# Patient Record
Sex: Female | Born: 1935 | Race: White | Hispanic: No | Marital: Married | Smoking: Never smoker
Health system: Southern US, Community
[De-identification: ages and names within clinical notes are randomized; demographics above are authoritative.]

## PROBLEM LIST (undated history)

## (undated) DIAGNOSIS — E669 Obesity, unspecified: Secondary | ICD-10-CM

## (undated) DIAGNOSIS — M1990 Unspecified osteoarthritis, unspecified site: Secondary | ICD-10-CM

## (undated) DIAGNOSIS — M1612 Unilateral primary osteoarthritis, left hip: Secondary | ICD-10-CM

## (undated) DIAGNOSIS — I10 Essential (primary) hypertension: Secondary | ICD-10-CM

## (undated) HISTORY — DX: Unilateral primary osteoarthritis, left hip: M16.12

## (undated) HISTORY — DX: Essential (primary) hypertension: I10

## (undated) HISTORY — DX: Unspecified osteoarthritis, unspecified site: M19.90

## (undated) HISTORY — DX: Obesity, unspecified: E66.9

---
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
## 1975-07-26 HISTORY — PX: ABDOMINAL HYSTERECTOMY: SHX81

## 2008-06-01 ENCOUNTER — Encounter: Payer: Self-pay | Admitting: Internal Medicine

## 2008-06-01 ENCOUNTER — Encounter: Admission: RE | Admit: 2008-06-01 | Discharge: 2008-06-01 | Payer: Self-pay | Admitting: Emergency Medicine

## 2008-06-04 ENCOUNTER — Ambulatory Visit: Payer: Self-pay | Admitting: Internal Medicine

## 2008-06-04 DIAGNOSIS — M545 Low back pain, unspecified: Secondary | ICD-10-CM | POA: Insufficient documentation

## 2008-06-04 DIAGNOSIS — M549 Dorsalgia, unspecified: Secondary | ICD-10-CM

## 2008-06-10 ENCOUNTER — Encounter: Payer: Self-pay | Admitting: Internal Medicine

## 2012-10-15 ENCOUNTER — Ambulatory Visit: Payer: Medicare PPO | Attending: Specialist

## 2012-10-15 DIAGNOSIS — M25659 Stiffness of unspecified hip, not elsewhere classified: Secondary | ICD-10-CM | POA: Insufficient documentation

## 2012-10-15 DIAGNOSIS — IMO0001 Reserved for inherently not codable concepts without codable children: Secondary | ICD-10-CM | POA: Insufficient documentation

## 2012-10-15 DIAGNOSIS — R262 Difficulty in walking, not elsewhere classified: Secondary | ICD-10-CM | POA: Insufficient documentation

## 2012-10-15 DIAGNOSIS — M25559 Pain in unspecified hip: Secondary | ICD-10-CM | POA: Insufficient documentation

## 2012-10-18 ENCOUNTER — Ambulatory Visit: Payer: Medicare PPO

## 2012-10-23 ENCOUNTER — Ambulatory Visit: Payer: Medicare PPO | Attending: Specialist | Admitting: Physical Therapy

## 2012-10-23 DIAGNOSIS — M25559 Pain in unspecified hip: Secondary | ICD-10-CM | POA: Insufficient documentation

## 2012-10-23 DIAGNOSIS — R262 Difficulty in walking, not elsewhere classified: Secondary | ICD-10-CM | POA: Insufficient documentation

## 2012-10-23 DIAGNOSIS — IMO0001 Reserved for inherently not codable concepts without codable children: Secondary | ICD-10-CM | POA: Insufficient documentation

## 2012-10-23 DIAGNOSIS — M25659 Stiffness of unspecified hip, not elsewhere classified: Secondary | ICD-10-CM | POA: Insufficient documentation

## 2012-10-25 ENCOUNTER — Ambulatory Visit: Payer: Medicare PPO | Admitting: Physical Therapy

## 2012-10-30 ENCOUNTER — Ambulatory Visit: Payer: Medicare PPO

## 2012-11-01 ENCOUNTER — Ambulatory Visit: Payer: Medicare PPO | Admitting: Physical Therapy

## 2012-11-06 ENCOUNTER — Ambulatory Visit: Payer: Medicare PPO | Admitting: Physical Therapy

## 2012-11-08 ENCOUNTER — Ambulatory Visit: Payer: Medicare PPO

## 2012-11-13 ENCOUNTER — Ambulatory Visit: Payer: Medicare PPO

## 2012-11-15 ENCOUNTER — Ambulatory Visit: Payer: Medicare PPO

## 2012-11-20 ENCOUNTER — Ambulatory Visit: Payer: 59 | Admitting: Family Medicine

## 2012-11-20 ENCOUNTER — Ambulatory Visit: Payer: Medicare PPO

## 2012-11-22 ENCOUNTER — Ambulatory Visit: Payer: Medicare PPO | Attending: Specialist

## 2012-11-22 DIAGNOSIS — M25659 Stiffness of unspecified hip, not elsewhere classified: Secondary | ICD-10-CM | POA: Insufficient documentation

## 2012-11-22 DIAGNOSIS — M25559 Pain in unspecified hip: Secondary | ICD-10-CM | POA: Insufficient documentation

## 2012-11-22 DIAGNOSIS — R262 Difficulty in walking, not elsewhere classified: Secondary | ICD-10-CM | POA: Insufficient documentation

## 2012-11-22 DIAGNOSIS — IMO0001 Reserved for inherently not codable concepts without codable children: Secondary | ICD-10-CM | POA: Insufficient documentation

## 2012-11-27 ENCOUNTER — Ambulatory Visit: Payer: Medicare PPO | Admitting: Physical Therapy

## 2012-11-29 ENCOUNTER — Ambulatory Visit: Payer: Medicare PPO

## 2012-12-24 ENCOUNTER — Encounter: Payer: Self-pay | Admitting: Family Medicine

## 2012-12-24 ENCOUNTER — Ambulatory Visit (INDEPENDENT_AMBULATORY_CARE_PROVIDER_SITE_OTHER): Payer: 59 | Admitting: Family Medicine

## 2012-12-24 VITALS — BP 146/90 | Temp 98.2°F | Ht 62.5 in | Wt 164.0 lb

## 2012-12-24 DIAGNOSIS — E785 Hyperlipidemia, unspecified: Secondary | ICD-10-CM

## 2012-12-24 DIAGNOSIS — R739 Hyperglycemia, unspecified: Secondary | ICD-10-CM

## 2012-12-24 DIAGNOSIS — R7309 Other abnormal glucose: Secondary | ICD-10-CM

## 2012-12-24 DIAGNOSIS — M1612 Unilateral primary osteoarthritis, left hip: Secondary | ICD-10-CM

## 2012-12-24 DIAGNOSIS — M48061 Spinal stenosis, lumbar region without neurogenic claudication: Secondary | ICD-10-CM

## 2012-12-24 DIAGNOSIS — Z7189 Other specified counseling: Secondary | ICD-10-CM

## 2012-12-24 DIAGNOSIS — E559 Vitamin D deficiency, unspecified: Secondary | ICD-10-CM

## 2012-12-24 DIAGNOSIS — Z7689 Persons encountering health services in other specified circumstances: Secondary | ICD-10-CM

## 2012-12-24 DIAGNOSIS — M169 Osteoarthritis of hip, unspecified: Secondary | ICD-10-CM

## 2012-12-24 LAB — LIPID PANEL
HDL: 120.3 mg/dL (ref >39.00)
Triglycerides: 51 mg/dL (ref 0.0–149.0)
VLDL: 10.2 mg/dL (ref 0.0–40.0)

## 2012-12-24 LAB — BASIC METABOLIC PANEL
CO2: 28 mEq/L (ref 19–32)
Calcium: 10.7 mg/dL — ABNORMAL HIGH (ref 8.4–10.5)
Creatinine, Ser: 0.7 mg/dL (ref 0.4–1.2)
GFR: 80.36 mL/min (ref >60.00)

## 2012-12-24 LAB — HEMOGLOBIN A1C: Hgb A1c MFr Bld: 5.5 % (ref 4.6–6.5)

## 2012-12-24 NOTE — Patient Instructions (Signed)
-  We have ordered labs or studies at this visit. It can take up to 1-2 weeks for results and processing. We will contact you with instructions IF your results are abnormal. Normal results will be released to your [facility]. If you have not heard from [place] or can not find your results in [facility] in 2 weeks please contact our office.  -PLEASE SIGN UP FOR MYCHART TODAY   We recommend the following healthy lifestyle measures: - eat a healthy diet consisting of lots of vegetables, fruits, beans, nuts, seeds, healthy meats such as white chicken and fish and whole grains.  - avoid fried foods, fast food, processed foods, sodas, red meet and other fattening foods.  - get a least 150 minutes of aerobic exercise per week.   -can use topical sports creams with menthol or capsacin for low back pain and tylenol 500-1000mg  up to 3 times daily on bad days for your arthritis. Please continue physical therapy exercises and regular activity.  -please use cane when walking and caution.   Follow up in: 1 year or sooner if concerns

## 2012-12-24 NOTE — Progress Notes (Signed)
Chief Complaint  Patient presents with  . Establish Care    HPI:  Lori Ramirez is here to establish care. Has not had a primary care doctor in five years. Mainly here to establish care. Has some low back pain for some time. Sees Dr. Isaias Cowman for this in ortho, she has had imaging and told has OA and spinal stenosis. Has done PT for this. She does well for the most part, still has some days where has pain. Has tripped in house a few times and has fallen. No injury. Now using cane.  Last PCP and physical: has not had in five years  Has the following chronic problems and concerns today:  Patient Active Problem List   Diagnosis Date Noted  . BACK PAIN 06/04/2008   Health Maintenance: -refused colon cancer screening -refused breast cancer screening -refused tdap -refused zostavax  ROS: See pertinent positives and negatives per HPI.  Past Medical History  Diagnosis Date  . Arthritis   . Osteoarthritis of left hip     Family History  Problem Relation Age of Onset  . Family history unknown: Yes    History   Social History  . Marital Status: Married    Spouse Name: N/A    Number of Children: N/A  . Years of Education: N/A   Social History Main Topics  . Smoking status: Never Smoker   . Smokeless tobacco: None  . Alcohol Use: Yes     Comment: 1 glass of wine daily   . Drug Use: None  . Sexually Active: None   Other Topics Concern  . 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      Spiritual Beliefs: protestant      Lifestyle: no regular exercise, well balanced diet             Current outpatient prescriptions:calcium gluconate 500 MG tablet, Take 500 mg by mouth daily., Disp: , Rfl: ;  Glucosamine-Chondroit-Vit C-Mn (GLUCOSAMINE 1500 COMPLEX PO), Take by mouth., Disp: , Rfl: ;  ibuprofen (ADVIL,MOTRIN) 200 MG tablet, Take 200 mg by mouth every 6 (six) hours as  needed for pain., Disp: , Rfl: ;  Multiple Vitamins-Minerals (ONE-A-DAY WOMENS 50 PLUS PO), Take by mouth., Disp: , Rfl:   EXAM:  Filed Vitals:   12/24/12 1113  BP: 146/90  Temp: 98.2 F (36.8 C)    Body mass index is 29.5 kg/(m^2).  GENERAL: vitals reviewed and listed above, alert, oriented, appears well hydrated and in no acute distress  HEENT: atraumatic, conjunttiva clear, no obvious abnormalities on inspection of external nose and ears  NECK: no obvious masses on inspection  LUNGS: clear to auscultation bilaterally, no wheezes, rales or rhonchi, good air movement  CV: HRRR, no peripheral edema  MS: moves all extremities without noticeable abnormality, walks with cane Normal Gait Normal inspection of back, no obvious scoliosis or leg length descrepancy No bony TTP Soft tissue TTP at: Lumbar paraspinal muscles -/+ tests: neg trendelenburg,-facet loading, -SLRT, -CLRT, -FABER, -FADIR Normal muscle strength, sensation to light touch and DTRs in LEs bilaterally   PSYCH: pleasant and cooperative, no obvious depression or anxiety  ASSESSMENT AND PLAN:  Discussed the following assessment and plan:  Unspecified vitamin D deficiency - Plan: Vitamin D, 25-hydroxy  Hyperglycemia - Plan: Hemoglobin A1c  Hyperlipemia - Plan: Lipid panel  Encounter to establish care - Plan:  Basic metabolic panel  Spinal stenosis of lumbar region  Osteoarthritis of left hip   -We reviewed the PMH, PSH, FH, SH, Meds and Allergies. -We provided refills for any medications we will prescribe as needed. -We addressed current concerns per orders and patient instructions. -We have asked for records for pertinent exams, studies, vaccines and notes from previous providers. -We have advised patient to follow up per instructions below. -we discussed health maintenance measures for her age - she refused colon and breast cancer screening, she refused all offered vaccines -NON-FASTING labs  today  -Patient advised to return or notify a doctor immediately if symptoms worsen or persist or new concerns arise.  Patient Instructions  -We have ordered labs or studies at this visit. It can take up to 1-2 weeks for results and processing. We will contact you with instructions IF your results are abnormal. Normal results will be released to your [facility]. If you have not heard from [place] or can not find your results in [facility] in 2 weeks please contact our office.  -PLEASE SIGN UP FOR MYCHART TODAY   We recommend the following healthy lifestyle measures: - eat a healthy diet consisting of lots of vegetables, fruits, beans, nuts, seeds, healthy meats such as white chicken and fish and whole grains.  - avoid fried foods, fast food, processed foods, sodas, red meet and other fattening foods.  - get a least 150 minutes of aerobic exercise per week.   -can use topical sports creams with menthol or capsacin for low back pain and tylenol 500-1000mg  up to 3 times daily on bad days for your arthritis. Please continue physical therapy exercises and regular activity.  -please use cane when walking and caution.   Follow up in: 1 year or sooner if concerns      Kriste Basque R.

## 2012-12-25 ENCOUNTER — Telehealth: Payer: Self-pay | Admitting: Family Medicine

## 2012-12-25 LAB — VITAMIN D 25 HYDROXY (VIT D DEFICIENCY, FRACTURES): Vit D, 25-Hydroxy: 55 ng/mL (ref 30–89)

## 2012-12-25 NOTE — Telephone Encounter (Signed)
Called and spoke with pt and pt is aware.  

## 2012-12-25 NOTE — Telephone Encounter (Signed)
Let her know labs look good

## 2013-03-08 ENCOUNTER — Other Ambulatory Visit (HOSPITAL_COMMUNITY): Payer: Self-pay | Admitting: Orthopedic Surgery

## 2013-03-08 ENCOUNTER — Encounter (HOSPITAL_COMMUNITY): Payer: Self-pay | Admitting: Pharmacy Technician

## 2013-03-08 NOTE — Patient Instructions (Addendum)
22 Lori Ramirez  03/08/2013   Your procedure is scheduled on: 03-19-2013  Report to [facility] at 1240 PM  Call this number if you have problems the morning of surgery [phone]   Remember:   Do not eat food :After Midnight.   clear liquids midnight until 940 am day of surgery, then nothing by mouth                                   SEE [facility] PREPARING FOR SURGERY SHEET   Do not wear jewelry, make-up or nail polish.  Do not wear lotions, powders, or perfumes. You may wear deodorant.   Men may shave face and neck.  Do not bring valuables to the hospital. [place] [facility] IS NOT RESPONSIBLE FOR VALUEABLES.  Contacts, dentures or bridgework may not be worn into surgery.  Leave suitcase in the car. After surgery it may be brought to your room.  For patients admitted to the hospital, checkout time is 11:00 AM the day of discharge.   Patients discharged the day of surgery will not be allowed to drive home.  Name and phone number of your driver:  Special Instructions: N/A   Please read over the following fact sheets that you were given: MRSA information, clear liquid sheet, incentive spirometer fact sheet, blood fact              sheet  Call Cain Sieve RN pre op nurse if needed [phone]    FAILURE TO FOLLOW THESE INSTRUCTIONS MAY RESULT IN THE CANCELLATION OF YOUR SURGERY.  PATIENT SIGNATURE___________________________________________  NURSE SIGNATURE_____________________________________________

## 2013-03-12 ENCOUNTER — Encounter (HOSPITAL_COMMUNITY)
Admission: RE | Admit: 2013-03-12 | Discharge: 2013-03-12 | Disposition: A | Discharge status: Home / Self Care | Payer: Medicare PPO | Source: Ambulatory Visit | Attending: Orthopedic Surgery | Admitting: Orthopedic Surgery

## 2013-03-12 ENCOUNTER — Encounter (HOSPITAL_COMMUNITY): Payer: Self-pay

## 2013-03-12 DIAGNOSIS — M1610 Unilateral primary osteoarthritis, unspecified hip: Secondary | ICD-10-CM | POA: Insufficient documentation

## 2013-03-12 DIAGNOSIS — E785 Hyperlipidemia, unspecified: Secondary | ICD-10-CM | POA: Insufficient documentation

## 2013-03-12 DIAGNOSIS — M169 Osteoarthritis of hip, unspecified: Secondary | ICD-10-CM | POA: Insufficient documentation

## 2013-03-12 DIAGNOSIS — Z01812 Encounter for preprocedural laboratory examination: Secondary | ICD-10-CM | POA: Insufficient documentation

## 2013-03-12 DIAGNOSIS — E559 Vitamin D deficiency, unspecified: Secondary | ICD-10-CM | POA: Insufficient documentation

## 2013-03-12 LAB — CBC
HCT: 41.6 % (ref 36.0–46.0)
Hemoglobin: 14.3 g/dL (ref 12.0–15.0)
MCH: 33 pg (ref 26.0–34.0)
MCHC: 34.4 g/dL (ref 30.0–36.0)
MCV: 96.1 fL (ref 78.0–100.0)
WBC: 5 10*3/uL (ref 4.0–10.5)

## 2013-03-12 LAB — PROTIME-INR
INR: 0.98 (ref 0.00–1.49)
Prothrombin Time: 12.8 seconds (ref 11.6–15.2)

## 2013-03-12 LAB — BASIC METABOLIC PANEL
Calcium: 10 mg/dL (ref 8.4–10.5)
Chloride: 102 mEq/L (ref 96–112)
Creatinine, Ser: 0.72 mg/dL (ref 0.50–1.10)
GFR calc Af Amer: >90 mL/min (ref >90)
Sodium: 139 mEq/L (ref 135–145)

## 2013-03-12 LAB — URINE MICROSCOPIC-ADD ON

## 2013-03-12 LAB — ABO/RH: ABO/RH(D): O POS

## 2013-03-12 LAB — APTT: aPTT: 26 seconds (ref 24–37)

## 2013-03-12 LAB — URINALYSIS, ROUTINE W REFLEX MICROSCOPIC
Bilirubin Urine: NEGATIVE
Nitrite: NEGATIVE
Specific Gravity, Urine: 1.029 (ref 1.005–1.030)
Urobilinogen, UA: 0.2 mg/dL (ref 0.0–1.0)

## 2013-03-12 LAB — SURGICAL PCR SCREEN: Staphylococcus aureus: NEGATIVE

## 2013-03-12 NOTE — Progress Notes (Signed)
BMp results faxed to Dr. Charlann Boxer per epic

## 2013-03-13 LAB — URINE CULTURE
Colony Count: NO GROWTH
Culture: NO GROWTH

## 2013-03-13 NOTE — Progress Notes (Signed)
Micro ua results faxed by epic to dr olin 

## 2013-03-13 NOTE — H&P (Signed)
TOTAL HIP ADMISSION H&P  Patient is admitted for left total hip arthroplasty, anterior approach.  Subjective:   Chief Complaint: Left hip OA / pain  HPI: Lori Ramirez, 79 y.o. female, has a history of pain and functional disability in the left hip(s) due to arthritis and patient has failed non-surgical conservative treatments for greater than 12 weeks to include NSAID's and/or analgesics, use of assistive devices and activity modification.  Onset of symptoms was gradual starting  years ago with rapidlly worsening for the last 3 months.The patient noted no past surgery on the left hip(s).  Patient currently rates pain in the left hip at 6 out of 10 with activity. Patient has worsening of pain with activity and weight bearing, trendelenberg gait, pain that interfers with activities of daily living and pain with passive range of motion. Patient has evidence of periarticular osteophytes and joint space narrowing by imaging studies. This condition presents safety issues increasing the risk of falls.  There is no current active infection. Risks, benefits and expectations were discussed with the patient. Patient understand the risks, benefits and expectations and wishes to proceed with surgery.   D/C Plans:      Home with HHPT  Post-op Meds:   Rx given for ASA, Zanaflex, Iron, Colace and MiraLax  Tranexamic Acid:   To be given  Decadron:    Not to be given - stated allergies  FYI:    ASA post-op   Stated that she can't take Codeine and tramadol made her sick, wants to try tylenol for pain.  Patient Active Problem List   Diagnosis Date Noted  . BACK PAIN 06/04/2008   Past Medical History  Diagnosis Date  . Arthritis   . Osteoarthritis of left hip     Past Surgical History  Procedure Laterality Date  . Abdominal hysterectomy  1977  . Tonsillectomy and adenoidectomy  1939    Allergies  Allergen Reactions  . Amoxicillin   . Cortisone Acetate     REACTION: rash  . Iodine     REACTION:  rash  . Prednisone     REACTION: rash  . Triamcinolone Acetonide     REACTION: rash    History  Substance Use Topics  . Smoking status: Never Smoker   . Smokeless tobacco: Never Used  . Alcohol Use: 1.2 oz/week    2 Glasses of wine per week     Comment: 1 glass of wine daily       Review of Systems  Constitutional: Negative.   HENT: Negative.   Eyes: Negative.   Respiratory: Negative.   Cardiovascular: Negative.   Gastrointestinal: Negative.   Genitourinary: Negative.   Musculoskeletal: Positive for back pain and joint pain.  Skin: Negative.   Neurological: Negative.   Endo/Heme/Allergies: Negative.   Psychiatric/Behavioral: Negative.     Objective:  Physical Exam  Constitutional: She is oriented to person, place, and time. She appears well-developed and well-nourished.  HENT:  Head: Normocephalic and atraumatic.  Mouth/Throat: Oropharynx is clear and moist.  Eyes: Pupils are equal, round, and reactive to light.  Neck: Neck supple. No JVD present. No tracheal deviation present. No thyromegaly present.  Cardiovascular: Normal rate, regular rhythm, normal heart sounds and intact distal pulses.   Respiratory: Effort normal and breath sounds normal. No stridor. No respiratory distress. She has no wheezes.  GI: Soft. There is no tenderness. There is no guarding.  Musculoskeletal:       Left hip: She exhibits decreased range of motion,  decreased strength, tenderness, bony tenderness and crepitus. She exhibits no swelling, no deformity and no laceration.  Lymphadenopathy:    She has no cervical adenopathy.  Neurological: She is alert and oriented to person, place, and time.  Skin: Skin is warm and dry.  Psychiatric: She has a normal mood and affect.     Labs:  Estimated body mass index is 29.50 kg/(m^2) as calculated from the following:   Height as of 12/24/12: 5' 2.5" (1.588 m).   Weight as of 12/24/12: 74.39 kg (164 lb).   Imaging Review Plain radiographs  demonstrate severe degenerative joint disease of the left hip(s). The bone quality appears to be good for age and reported activity level.  Assessment/Plan:  End stage arthritis, left hip(s)  The patient history, physical examination, clinical judgement of the provider and imaging studies are consistent with end stage degenerative joint disease of the left hip(s) and total hip arthroplasty is deemed medically necessary. The treatment options including medical management, injection therapy, arthroscopy and arthroplasty were discussed at length. The risks and benefits of total hip arthroplasty were presented and reviewed. The risks due to aseptic loosening, infection, stiffness, dislocation/subluxation,  thromboembolic complications and other imponderables were discussed.  The patient acknowledged the explanation, agreed to proceed with the plan and consent was signed. Patient is being admitted for inpatient treatment for surgery, pain control, PT, OT, prophylactic antibiotics, VTE prophylaxis, progressive ambulation and ADL's and discharge planning.The patient is planning to be discharged home with home health services.    Lori Ramirez   PAC  03/13/2013, 5:48 PM

## 2013-03-14 ENCOUNTER — Encounter: Payer: Self-pay | Admitting: Family Medicine

## 2013-03-14 ENCOUNTER — Ambulatory Visit (INDEPENDENT_AMBULATORY_CARE_PROVIDER_SITE_OTHER): Payer: Medicare PPO | Admitting: Family Medicine

## 2013-03-14 VITALS — BP 128/88 | HR 80 | Temp 98.2°F | Wt 160.0 lb

## 2013-03-14 DIAGNOSIS — M169 Osteoarthritis of hip, unspecified: Secondary | ICD-10-CM

## 2013-03-14 NOTE — Progress Notes (Signed)
Chief Complaint  Patient presents with  . Medical Clearance    HPI:  Patient present for optimization of general medical care prior to hip surgery.  Kidney disease? No Prior surgeries/Issues following anesthesia? Multiple prior surgeries (tonsillectomy, hysterectomy) no issues with anesthesia Hx MI, heart arrythmia, CHF, angina or stroke? none Epilepsy or Seizures? none Arthritis or problems with neck or jaw? none Thyroid disease? none Liver disease? none Asthma, COPD or chronic lung disease? none Diabetes? none  Other: Poor nutrition, Frail or other: no  METS: >4  AHA Risks: NONE  Type of surgery/Risk: orthopedic, intermediate  Medications that need to be addressed prior to surgery: None - pt reports she currently is not taking any medications and stopped asa > 7 days prior to surgery.  ROS: See pertinent positives and negatives per HPI. 11 point ROS negative except where noted.  Past Medical History  Diagnosis Date  . Arthritis   . Osteoarthritis of left hip     No family history on file.  History   Social History  . Marital Status: Married    Spouse Name: N/A    Number of Children: N/A  . Years of Education: N/A   Social History Main Topics  . Smoking status: Never Smoker   . Smokeless tobacco: Never Used  . Alcohol Use: 1.2 oz/week    2 Glasses of wine per week     Comment: 1 glass of wine daily   . Drug Use: No  . Sexual Activity: None   Other Topics Concern  . 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 , Disp: , Rfl:  ibuprofen (ADVIL,MOTRIN) 200 MG tablet, Take 200 mg by mouth every 6 (six) hours as needed for pain., Disp: , Rfl: ;  Multiple Vitamins-Minerals (ONE-A-DAY WOMENS 50 PLUS PO), Take by mouth., Disp: , Rfl: ;  OVER THE COUNTER MEDICATION, Pt takes 1 a day stool softener  Pt does not know name, Disp: , Rfl:   EXAM:  Filed Vitals:   03/14/13 0839  BP: 128/88  Pulse: 80  Temp: 98.2 F (36.8 C)    Body mass index is 27.45 kg/(m^2).  GENERAL: vitals reviewed and listed above, alert, oriented, appears well hydrated and in no acute distress  HEENT: atraumatic, conjunttiva clear, no obvious abnormalities on inspection of external nose and ears  NECK: no obvious masses on inspection, no carotid bruits  LUNGS: clear to auscultation bilaterally, no wheezes, rales or rhonchi, good air movement  CV: HRRR, no peripheral edema, no JVD, BP normal range, normal radial pulses  MS: moves all extremities without noticeable abnormality  PSYCH: pleasant and cooperative, no obvious depression or anxiety  ASSESSMENT AND PLAN:  Discussed the following assessment and plan:  Osteoarthrosis, hip  Assessment: -Risk factors: none -Surgery Risks:intermediate -age, nutritional  status, fraility: -functional capacity: > 4 METs wihtout symptoms -comorbidities: none Patient Specific Risks: patient is low risk for intermediate risks surgery   Recommendations for optimizing general medical care prior to surgery: -discussed CV risks for intermediate surgery with patient -advised patient to discuss surgery specific risks with her with surgeon -advised patient will defer to surgeon for post-op DVT prophylaxis and post op care -advised no specific medical recommendations for this patient at this time and no recommendations to defer surgery or for further CV  testing prior to surgery  > 25 minutes spent face to face in counseling this patient  -Patient advised to return or notify a doctor immediately if symptoms worsen or persist or new concerns arise.  There are no Patient Instructions on file for this visit.   Kriste Basque R.

## 2013-03-19 ENCOUNTER — Inpatient Hospital Stay (HOSPITAL_COMMUNITY): Payer: Medicare PPO

## 2013-03-19 ENCOUNTER — Inpatient Hospital Stay (HOSPITAL_COMMUNITY): Payer: Medicare PPO | Admitting: Anesthesiology

## 2013-03-19 ENCOUNTER — Inpatient Hospital Stay (HOSPITAL_COMMUNITY)
Admission: RE | Admit: 2013-03-19 | Discharge: 2013-03-21 | DRG: 470 | Disposition: A | Discharge status: Home Health | Payer: Medicare PPO | Source: Ambulatory Visit | Attending: Orthopedic Surgery | Admitting: Orthopedic Surgery

## 2013-03-19 ENCOUNTER — Encounter (HOSPITAL_COMMUNITY)
Admission: RE | Disposition: A | Discharge status: Home Health | Payer: Self-pay | Source: Ambulatory Visit | Attending: Orthopedic Surgery

## 2013-03-19 ENCOUNTER — Encounter (HOSPITAL_COMMUNITY): Payer: Self-pay | Admitting: Anesthesiology

## 2013-03-19 ENCOUNTER — Encounter (HOSPITAL_COMMUNITY): Payer: Self-pay | Admitting: *Deleted

## 2013-03-19 DIAGNOSIS — Z01812 Encounter for preprocedural laboratory examination: Secondary | ICD-10-CM

## 2013-03-19 DIAGNOSIS — M549 Dorsalgia, unspecified: Secondary | ICD-10-CM | POA: Diagnosis present

## 2013-03-19 DIAGNOSIS — M1610 Unilateral primary osteoarthritis, unspecified hip: Principal | ICD-10-CM | POA: Diagnosis present

## 2013-03-19 DIAGNOSIS — Z96649 Presence of unspecified artificial hip joint: Secondary | ICD-10-CM

## 2013-03-19 DIAGNOSIS — M169 Osteoarthritis of hip, unspecified: Principal | ICD-10-CM | POA: Diagnosis present

## 2013-03-19 HISTORY — PX: TOTAL HIP ARTHROPLASTY: SHX124

## 2013-03-19 LAB — TYPE AND SCREEN
ABO/RH(D): O POS
Antibody Screen: NEGATIVE

## 2013-03-19 SURGERY — ARTHROPLASTY, HIP, TOTAL, ANTERIOR APPROACH
Anesthesia: Spinal | Site: Hip | Laterality: Left

## 2013-03-19 MED ORDER — CELECOXIB 200 MG PO CAPS
200.0000 mg | ORAL_CAPSULE | Freq: Two times a day (BID) | ORAL | Status: DC
  Administered 2013-03-19 – 2013-03-21 (×4): 200 mg via ORAL
  Filled 2013-03-19 (×5): qty 1

## 2013-03-19 MED ORDER — FENTANYL CITRATE 0.05 MG/ML IJ SOLN
INTRAMUSCULAR | Status: DC | PRN
  Administered 2013-03-19: 100 ug via INTRAVENOUS

## 2013-03-19 MED ORDER — BISACODYL 10 MG RE SUPP: 10.0000 mg | Freq: Every day | RECTAL | Status: DC | PRN

## 2013-03-19 MED ORDER — PROMETHAZINE HCL 25 MG/ML IJ SOLN: 6.2500 mg | INTRAMUSCULAR | Status: DC | PRN

## 2013-03-19 MED ORDER — HYDRALAZINE HCL 20 MG/ML IJ SOLN
INTRAMUSCULAR | Status: AC
  Administered 2013-03-19: 10 mg
  Filled 2013-03-19: qty 1

## 2013-03-19 MED ORDER — METHOCARBAMOL 500 MG PO TABS
500.0000 mg | ORAL_TABLET | Freq: Four times a day (QID) | ORAL | Status: DC | PRN
  Filled 2013-03-19: qty 1

## 2013-03-19 MED ORDER — CLINDAMYCIN PHOSPHATE 600 MG/50ML IV SOLN
600.0000 mg | Freq: Four times a day (QID) | INTRAVENOUS | Status: AC
  Administered 2013-03-19 – 2013-03-20 (×2): 600 mg via INTRAVENOUS
  Filled 2013-03-19 (×2): qty 50

## 2013-03-19 MED ORDER — ZOLPIDEM TARTRATE 5 MG PO TABS: 5.0000 mg | ORAL_TABLET | Freq: Every evening | ORAL | Status: DC | PRN

## 2013-03-19 MED ORDER — MIDAZOLAM HCL 5 MG/5ML IJ SOLN
INTRAMUSCULAR | Status: DC | PRN
  Administered 2013-03-19: 2 mg via INTRAVENOUS

## 2013-03-19 MED ORDER — ALUM & MAG HYDROXIDE-SIMETH 200-200-20 MG/5ML PO SUSP: 30.0000 mL | ORAL | Status: DC | PRN

## 2013-03-19 MED ORDER — HYDROCODONE-ACETAMINOPHEN 7.5-325 MG PO TABS
1.0000 | ORAL_TABLET | ORAL | Status: DC | PRN
  Administered 2013-03-19 – 2013-03-20 (×2): 2 via ORAL
  Filled 2013-03-19 (×2): qty 2

## 2013-03-19 MED ORDER — ASPIRIN EC 325 MG PO TBEC
325.0000 mg | DELAYED_RELEASE_TABLET | Freq: Two times a day (BID) | ORAL | Status: DC
  Administered 2013-03-20 – 2013-03-21 (×3): 325 mg via ORAL
  Filled 2013-03-19 (×5): qty 1

## 2013-03-19 MED ORDER — LACTATED RINGERS IV SOLN
INTRAVENOUS | Status: DC
  Administered 2013-03-19: 17:00:00 via INTRAVENOUS
  Administered 2013-03-19 (×2): 1000 mL via INTRAVENOUS

## 2013-03-19 MED ORDER — HYDRALAZINE HCL 20 MG/ML IJ SOLN
10.0000 mg | Freq: Four times a day (QID) | INTRAMUSCULAR | Status: DC | PRN
  Administered 2013-03-19: 23:00:00 via INTRAVENOUS

## 2013-03-19 MED ORDER — ONDANSETRON HCL 4 MG PO TABS: 4.0000 mg | ORAL_TABLET | Freq: Four times a day (QID) | ORAL | Status: DC | PRN

## 2013-03-19 MED ORDER — POLYETHYLENE GLYCOL 3350 17 G PO PACK
17.0000 g | PACK | Freq: Two times a day (BID) | ORAL | Status: DC
  Administered 2013-03-19 – 2013-03-21 (×4): 17 g via ORAL

## 2013-03-19 MED ORDER — FERROUS SULFATE 325 (65 FE) MG PO TABS
325.0000 mg | ORAL_TABLET | Freq: Three times a day (TID) | ORAL | Status: DC
  Administered 2013-03-20 – 2013-03-21 (×2): 325 mg via ORAL
  Filled 2013-03-19 (×7): qty 1

## 2013-03-19 MED ORDER — EPHEDRINE SULFATE 50 MG/ML IJ SOLN
INTRAMUSCULAR | Status: DC | PRN
  Administered 2013-03-19: 5 mg via INTRAVENOUS
  Administered 2013-03-19: 10 mg via INTRAVENOUS
  Administered 2013-03-19: 5 mg via INTRAVENOUS

## 2013-03-19 MED ORDER — HYDROMORPHONE HCL PF 1 MG/ML IJ SOLN
0.5000 mg | INTRAMUSCULAR | Status: DC | PRN
  Administered 2013-03-19: 1 mg via INTRAVENOUS
  Filled 2013-03-19: qty 1

## 2013-03-19 MED ORDER — METOCLOPRAMIDE HCL 10 MG PO TABS: 5.0000 mg | ORAL_TABLET | Freq: Three times a day (TID) | ORAL | Status: DC | PRN

## 2013-03-19 MED ORDER — PROPOFOL INFUSION 10 MG/ML OPTIME
INTRAVENOUS | Status: DC | PRN
  Administered 2013-03-19: 50 ug/kg/min via INTRAVENOUS

## 2013-03-19 MED ORDER — SODIUM CHLORIDE 0.9 % IV SOLN
10.0000 mg | INTRAVENOUS | Status: DC | PRN
  Administered 2013-03-19: 10 ug/min via INTRAVENOUS

## 2013-03-19 MED ORDER — CLINDAMYCIN PHOSPHATE 900 MG/50ML IV SOLN
900.0000 mg | INTRAVENOUS | Status: AC
  Administered 2013-03-19: 900 mg via INTRAVENOUS
  Filled 2013-03-19: qty 50

## 2013-03-19 MED ORDER — HYDROMORPHONE HCL PF 1 MG/ML IJ SOLN: 0.2500 mg | INTRAMUSCULAR | Status: DC | PRN

## 2013-03-19 MED ORDER — BUPIVACAINE HCL (PF) 0.75 % IJ SOLN
INTRAMUSCULAR | Status: DC | PRN
  Administered 2013-03-19: 2 mL via INTRATHECAL

## 2013-03-19 MED ORDER — KETAMINE HCL 10 MG/ML IJ SOLN
INTRAMUSCULAR | Status: DC | PRN
  Administered 2013-03-19: 20 mg via INTRAVENOUS

## 2013-03-19 MED ORDER — METHOCARBAMOL 100 MG/ML IJ SOLN
500.0000 mg | Freq: Four times a day (QID) | INTRAVENOUS | Status: DC | PRN
  Administered 2013-03-19: 500 mg via INTRAVENOUS
  Filled 2013-03-19 (×2): qty 5

## 2013-03-19 MED ORDER — PHENOL 1.4 % MT LIQD
1.0000 | OROMUCOSAL | Status: DC | PRN
  Filled 2013-03-19: qty 177

## 2013-03-19 MED ORDER — MENTHOL 3 MG MT LOZG
1.0000 | LOZENGE | OROMUCOSAL | Status: DC | PRN
  Filled 2013-03-19: qty 9

## 2013-03-19 MED ORDER — ONDANSETRON HCL 4 MG/2ML IJ SOLN
4.0000 mg | Freq: Four times a day (QID) | INTRAMUSCULAR | Status: DC | PRN
  Administered 2013-03-20: 4 mg via INTRAVENOUS
  Filled 2013-03-19: qty 2

## 2013-03-19 MED ORDER — DIPHENHYDRAMINE HCL 25 MG PO CAPS: 25.0000 mg | ORAL_CAPSULE | Freq: Four times a day (QID) | ORAL | Status: DC | PRN

## 2013-03-19 MED ORDER — DOCUSATE SODIUM 100 MG PO CAPS
100.0000 mg | ORAL_CAPSULE | Freq: Two times a day (BID) | ORAL | Status: DC
  Administered 2013-03-19 – 2013-03-21 (×4): 100 mg via ORAL

## 2013-03-19 MED ORDER — TRANEXAMIC ACID 100 MG/ML IV SOLN
1000.0000 mg | Freq: Once | INTRAVENOUS | Status: AC
  Administered 2013-03-19: 1000 mg via INTRAVENOUS
  Filled 2013-03-19: qty 10

## 2013-03-19 MED ORDER — SODIUM CHLORIDE 0.9 % IV SOLN
100.0000 mL/h | INTRAVENOUS | Status: DC
  Administered 2013-03-20: 100 mL/h via INTRAVENOUS
  Filled 2013-03-19 (×5): qty 1000

## 2013-03-19 MED ORDER — FLEET ENEMA 7-19 GM/118ML RE ENEM: 1.0000 | ENEMA | Freq: Once | RECTAL | Status: AC | PRN

## 2013-03-19 MED ORDER — 0.9 % SODIUM CHLORIDE (POUR BTL) OPTIME
TOPICAL | Status: DC | PRN
  Administered 2013-03-19: 1000 mL

## 2013-03-19 MED ORDER — METOCLOPRAMIDE HCL 5 MG/ML IJ SOLN: 5.0000 mg | Freq: Three times a day (TID) | INTRAMUSCULAR | Status: DC | PRN

## 2013-03-19 SURGICAL SUPPLY — 42 items
ADH SKN CLS APL DERMABOND .7 (GAUZE/BANDAGES/DRESSINGS) ×1
BAG SPEC THK2 15X12 ZIP CLS (MISCELLANEOUS) ×2
BAG ZIPLOCK 12X15 (MISCELLANEOUS) ×4 IMPLANT
BLADE SAW SGTL 18X1.27X75 (BLADE) ×2 IMPLANT
CAPT HIP PF MOP ×1 IMPLANT
CHLORAPREP W/TINT 26ML (MISCELLANEOUS) ×1 IMPLANT
CLOTH BEACON ORANGE TIMEOUT ST (SAFETY) ×2 IMPLANT
DERMABOND ADVANCED (GAUZE/BANDAGES/DRESSINGS) ×1
DERMABOND ADVANCED .7 DNX12 (GAUZE/BANDAGES/DRESSINGS) ×1 IMPLANT
DRAPE C-ARM 42X120 X-RAY (DRAPES) ×2 IMPLANT
DRAPE STERI IOBAN 125X83 (DRAPES) ×1 IMPLANT
DRAPE U-SHAPE 47X51 STRL (DRAPES) ×6 IMPLANT
DRSG AQUACEL AG ADV 3.5X 4 (GAUZE/BANDAGES/DRESSINGS) ×2 IMPLANT
DRSG AQUACEL AG ADV 3.5X10 (GAUZE/BANDAGES/DRESSINGS) ×2 IMPLANT
DRSG TEGADERM 4X4.75 (GAUZE/BANDAGES/DRESSINGS) IMPLANT
DURAPREP 26ML APPLICATOR (WOUND CARE) ×1 IMPLANT
ELECT BLADE TIP CTD 4 INCH (ELECTRODE) ×2 IMPLANT
ELECT REM PT RETURN 9FT ADLT (ELECTROSURGICAL) ×2
ELECTRODE REM PT RTRN 9FT ADLT (ELECTROSURGICAL) ×1 IMPLANT
EVACUATOR 1/8 PVC DRAIN (DRAIN) IMPLANT
FACESHIELD LNG OPTICON STERILE (SAFETY) ×8 IMPLANT
GAUZE SPONGE 2X2 8PLY STRL LF (GAUZE/BANDAGES/DRESSINGS) ×1 IMPLANT
GLOVE BIOGEL PI IND STRL 7.5 (GLOVE) ×1 IMPLANT
GLOVE BIOGEL PI IND STRL 8 (GLOVE) ×1 IMPLANT
GLOVE BIOGEL PI INDICATOR 7.5 (GLOVE) ×1
GLOVE BIOGEL PI INDICATOR 8 (GLOVE) ×1
GLOVE ECLIPSE 8.0 STRL XLNG CF (GLOVE) ×2 IMPLANT
GLOVE ORTHO TXT STRL SZ7.5 (GLOVE) ×4 IMPLANT
GOWN BRE IMP PREV XXLGXLNG (GOWN DISPOSABLE) ×2 IMPLANT
GOWN STRL NON-REIN LRG LVL3 (GOWN DISPOSABLE) ×2 IMPLANT
KIT BASIN OR (CUSTOM PROCEDURE TRAY) ×2 IMPLANT
PACK TOTAL JOINT (CUSTOM PROCEDURE TRAY) ×2 IMPLANT
PADDING CAST COTTON 6X4 STRL (CAST SUPPLIES) ×2 IMPLANT
SPONGE GAUZE 2X2 STER 10/PKG (GAUZE/BANDAGES/DRESSINGS)
SUCTION FRAZIER 12FR DISP (SUCTIONS) ×2 IMPLANT
SUT MNCRL AB 4-0 PS2 18 (SUTURE) ×2 IMPLANT
SUT VIC AB 1 CT1 36 (SUTURE) ×8 IMPLANT
SUT VIC AB 2-0 CT1 27 (SUTURE) ×4
SUT VIC AB 2-0 CT1 TAPERPNT 27 (SUTURE) ×2 IMPLANT
SUT VLOC 180 0 24IN GS25 (SUTURE) ×2 IMPLANT
TOWEL OR 17X26 10 PK STRL BLUE (TOWEL DISPOSABLE) ×4 IMPLANT
TRAY FOLEY CATH 14FRSI W/METER (CATHETERS) ×2 IMPLANT

## 2013-03-19 NOTE — Interval H&P Note (Signed)
History and Physical Interval Note:  03/19/2013 1:26 PM  Lori Ramirez  has presented today for surgery, with the diagnosis of left hip osteoarthritis  The various methods of treatment have been discussed with the patient and family. After consideration of risks, benefits and other options for treatment, the patient has consented to  Procedure(s): LEFT TOTAL HIP ARTHROPLASTY ANTERIOR APPROACH (Left) as a surgical intervention .  The patient's history has been reviewed, patient examined, no change in status, stable for surgery.  I have reviewed the patient's chart and labs.  Questions were answered to the patient's satisfaction.     [name]

## 2013-03-19 NOTE — Transfer of Care (Signed)
Immediate Anesthesia Transfer of Care Note  Patient: Lori Ramirez  Procedure(s) Performed: Procedure(s): LEFT TOTAL HIP ARTHROPLASTY ANTERIOR APPROACH (Left)  Patient Location: PACU  Anesthesia Type:Spinal  Level of Consciousness: sedated  Airway & Oxygen Therapy: Patient Spontanous Breathing and Patient connected to face mask oxygen  Post-op Assessment: Report given to PACU RN and Post -op Vital signs reviewed and stable  Post vital signs: Reviewed and stable  Complications: No apparent anesthesia complications

## 2013-03-19 NOTE — Op Note (Signed)
NAME:  Lori Ramirez                ACCOUNT NO.: [id number]      MEDICAL RECORD NO.: [id number]      FACILITY:  [facility]      PHYSICIAN:  Durene Romans D  DATE OF BIRTH:  18-Dec-1930     DATE OF PROCEDURE:  03/19/2013                                 OPERATIVE REPORT         PREOPERATIVE DIAGNOSIS: Left  hip osteoarthritis.      POSTOPERATIVE DIAGNOSIS:  Left hip severe osteoarthritis.      PROCEDURE:  Left total hip replacement through an anterior approach   utilizing DePuy THR system, component size 50mm pinnacle cup, a size 32+4 neutral   Altrex liner, a size 1 Hi Tri Lock stem with a 32+5 articuleze metal ball      SURGEON:  Madlyn Frankel. Charlann Boxer, M.D.      ASSISTANT:  Lanney Gins, PA-C      ANESTHESIA:  General.      SPECIMENS:  None.      COMPLICATIONS:  None.      BLOOD LOSS:  250 cc     DRAINS:  One Hemovac.      INDICATION OF THE PROCEDURE:  Lori Ramirez is a 79 y.o. female who had   presented to office for evaluation of left hip pain.  Radiographs revealed   severe degenerative changes with bone-on-bone   articulation to the  hip joint including deformity of the femoral head.  The patient had painful limited range of   motion significantly affecting their overall quality of life.  The patient was failing to    respond to conservative measures, and at this point was ready   to proceed with more definitive measures.  The patient has noted progressive   degenerative changes in his hip, progressive problems and dysfunction   with regarding the hip prior to surgery.  Consent was obtained for   benefit of pain relief.  Specific risk of infection, DVT, component   failure, dislocation, need for revision surgery, as well discussion of   the anterior versus posterior approach were reviewed.  Consent was   obtained for benefit of anterior pain relief through an anterior   approach.      PROCEDURE IN DETAIL:  The patient was brought to operative  theater.   Once adequate anesthesia, preoperative antibiotics, 900mg  of Clindamycin administered.   The patient was positioned supine on the OSI Hanna table.  Once adequate   padding of boney process was carried out, we had predraped out the hip, and  used fluoroscopy to confirm orientation of the pelvis and position.      The left hip was then prepped and draped from proximal iliac crest to   mid thigh with shower curtain technique.      Time-out was performed identifying the patient, planned procedure, and   extremity.     An incision was then made 2 cm distal and lateral to the   anterior superior iliac spine extending over the orientation of the   tensor fascia lata muscle and sharp dissection was carried down to the   fascia of the muscle and protractor placed in the soft tissues.      The fascia was then incised.  The muscle belly was identified and swept   laterally and retractor placed along the superior neck.  Following   cauterization of the circumflex vessels and removing some pericapsular   fat, a second cobra retractor was placed on the inferior neck.  A third   retractor was placed on the anterior acetabulum after elevating the   anterior rectus.  A L-capsulotomy was along the line of the   superior neck to the trochanteric fossa, then extended proximally and   distally.  Tag sutures were placed and the retractors were then placed   intracapsular.  We then identified the trochanteric fossa and   orientation of my neck cut, confirmed this radiographically   and then made a neck osteotomy with the femur on traction.  The femoral   head was removed without difficulty or complication.  Traction was let   off and retractors were placed posterior and anterior around the   acetabulum.      The labrum and foveal tissue were debrided.  I began reaming with a 47mm   reamer and reamed up to 49mm reamer with good bony bed preparation and a 50   cup was chosen.  The final 50mm  Pinnacle cup was then impacted under fluoroscopy  to confirm the depth of penetration and orientation with respect to   abduction.  A screw was placed followed by the hole eliminator.  The final   32+4 neutral Altrex liner was impacted with good visualized rim fit.  The cup was positioned anatomically within the acetabular portion of the pelvis.      At this point, the femur was rolled at 80 degrees.  Further capsule was   released off the inferior aspect of the femoral neck.  I then   released the superior capsule proximally.  The hook was placed laterally   along the femur and elevated manually and held in position with the bed   hook.  The leg was then extended and adducted with the leg rolled to 100   degrees of external rotation.  Once the proximal femur was fully   exposed, I used a box osteotome to set orientation.  I then began   broaching with the starting chili pepper broach and passed this by hand and then broached up to 1.  With the 1 broach in place I chose a high offset neck and did a trial reduction.  The offset was appropriate, leg lengths   appeared to be equal, confirmed radiographically.   Given these findings, I went ahead and dislocated the hip, repositioned all   retractors and positioned the right hip in the extended and abducted position.  The final size 1 Tri Lock stem was   chosen and it was impacted down to the level of neck cut.  Based on this   and the trial reduction, a 32 +5 articuleze metal ball was chosen and   impacted onto a clean and dry trunnion, and the hip was reduced.  The   hip had been irrigated throughout the case again at this point.  I did   reapproximate the superior capsular leaflet to the anterior leaflet   using #1 Vicryl, placed a medium Hemovac drain deep.  The fascia of the   tensor fascia lata muscle was then reapproximated using #1 Vicryl.  The   remaining wound was closed with 2-0 Vicryl and running 4-0 Monocryl.   The hip was cleaned,  dried, and dressed sterilely using Dermabond and  Aquacel dressing.  Drain site dressed separately.  She was then brought   to recovery room in stable condition tolerating the procedure well.    Danae Orleans, PA-C was present for the entirety of the case involved from   preoperative positioning, perioperative retractor management, general   facilitation of the case, as well as primary wound closure as assistant.            Pietro Cassis Alvan Dame, M.D.            MDO/MEDQ  D:  05/17/2011  T:  05/17/2011  Job:  [id number]      Electronically Signed by Paralee Cancel M.D. on 05/23/2011 09:15:38 AM

## 2013-03-19 NOTE — Anesthesia Postprocedure Evaluation (Signed)
  Anesthesia Post-op Note  Patient: Lori Ramirez  Procedure(s) Performed: Procedure(s) (LRB): LEFT TOTAL HIP ARTHROPLASTY ANTERIOR APPROACH (Left)  Patient Location: PACU  Anesthesia Type: Spinal  Level of Consciousness: awake and alert   Airway and Oxygen Therapy: Patient Spontanous Breathing  Post-op Pain: mild  Post-op Assessment: Post-op Vital signs reviewed, Patient's Cardiovascular Status Stable, Respiratory Function Stable, Patent Airway and No signs of Nausea or vomiting  Last Vitals:  Filed Vitals:   03/19/13 1949  BP: 176/87  Pulse: 56  Temp: 36.4 C  Resp:     Post-op Vital Signs: stable   Complications: No apparent anesthesia complications. Moving both feet.

## 2013-03-19 NOTE — Anesthesia Preprocedure Evaluation (Signed)
Anesthesia Evaluation  Patient identified by MRN, date of birth, ID band Patient awake    Reviewed: Allergy & Precautions, H&P , NPO status , Patient's Chart, lab work & pertinent test results  Airway Mallampati: II TM Distance: >3 FB Neck ROM: Full    Dental no notable dental hx.    Pulmonary neg pulmonary ROS,  breath sounds clear to auscultation  Pulmonary exam normal       Cardiovascular Exercise Tolerance: Good negative cardio ROS  Rhythm:Regular Rate:Normal     Neuro/Psych Back pain negative neurological ROS  negative psych ROS   GI/Hepatic negative GI ROS, Neg liver ROS,   Endo/Other  negative endocrine ROS  Renal/GU negative Renal ROS  negative genitourinary   Musculoskeletal negative musculoskeletal ROS (+)   Abdominal   Peds negative pediatric ROS (+)  Hematology negative hematology ROS (+)   Anesthesia Other Findings   Reproductive/Obstetrics negative OB ROS                           Anesthesia Physical Anesthesia Plan  ASA: II  Anesthesia Plan: Spinal   Post-op Pain Management:    Induction: Intravenous  Airway Management Planned:   Additional Equipment:   Intra-op Plan:   Post-operative Plan:   Informed Consent: I have reviewed the patients History and Physical, chart, labs and discussed the procedure including the risks, benefits and alternatives for the proposed anesthesia with the patient or authorized representative who has indicated his/her understanding and acceptance.   Dental advisory given  Plan Discussed with: CRNA  Anesthesia Plan Comments: (Discussed risks/benefits of spinal including headache, backache, failure, bleeding, infection, and nerve damage. Patient consents to spinal. Questions answered. Coagulation studies and platelet count acceptable. She does have a h/o back pain and sciatica.)        Anesthesia Quick Evaluation

## 2013-03-19 NOTE — Progress Notes (Signed)
Called MD/PA-C on call as post op VS reveal elevated Systolic BP 176/87 then 164/80 then 189/85. Will make PA-C aware prior to MN.

## 2013-03-19 NOTE — Anesthesia Procedure Notes (Signed)
Spinal  Start time: 03/19/2013 4:47 PM End time: 03/19/2013 4:50 PM Staffing CRNA/Resident: Carmelia Roller R Preanesthetic Checklist Completed: patient identified, site marked, surgical consent, pre-op evaluation, timeout performed, IV checked, risks and benefits discussed and monitors and equipment checked Spinal Block Patient position: sitting Prep: ChloraPrep and site prepped and draped Patient monitoring: heart rate, continuous pulse ox and blood pressure Approach: midline Location: L3-4 Injection technique: single-shot Needle Needle gauge: 22 G Needle length: 9 cm Assessment Sensory level: T4

## 2013-03-20 LAB — CBC
HCT: 35.2 % — ABNORMAL LOW (ref 36.0–46.0)
Hemoglobin: 12.3 g/dL (ref 12.0–15.0)
MCH: 33.1 pg (ref 26.0–34.0)
MCV: 94.6 fL (ref 78.0–100.0)
RBC: 3.72 MIL/uL — ABNORMAL LOW (ref 3.87–5.11)

## 2013-03-20 LAB — BASIC METABOLIC PANEL
CO2: 28 mEq/L (ref 19–32)
Calcium: 9.2 mg/dL (ref 8.4–10.5)
Creatinine, Ser: 0.6 mg/dL (ref 0.50–1.10)
Glucose, Bld: 152 mg/dL — ABNORMAL HIGH (ref 70–99)

## 2013-03-20 MED ORDER — POLYETHYLENE GLYCOL 3350 17 G PO PACK: 17.0000 g | PACK | Freq: Two times a day (BID) | ORAL | Status: DC

## 2013-03-20 MED ORDER — HYDROCODONE-ACETAMINOPHEN 5-325 MG PO TABS: 1.0000 | ORAL_TABLET | ORAL | Status: DC | PRN

## 2013-03-20 MED ORDER — FERROUS SULFATE 325 (65 FE) MG PO TABS: 325.0000 mg | ORAL_TABLET | Freq: Three times a day (TID) | ORAL | Status: DC

## 2013-03-20 MED ORDER — ACETAMINOPHEN 325 MG PO TABS
325.0000 mg | ORAL_TABLET | Freq: Four times a day (QID) | ORAL | Status: DC | PRN
  Administered 2013-03-20 – 2013-03-21 (×2): 650 mg via ORAL
  Filled 2013-03-20 (×2): qty 2

## 2013-03-20 MED ORDER — TIZANIDINE HCL 4 MG PO CAPS: 4.0000 mg | ORAL_CAPSULE | Freq: Three times a day (TID) | ORAL | Status: DC | PRN

## 2013-03-20 MED ORDER — DSS 100 MG PO CAPS: 100.0000 mg | ORAL_CAPSULE | Freq: Two times a day (BID) | ORAL | Status: DC

## 2013-03-20 MED ORDER — ASPIRIN 325 MG PO TBEC: 325.0000 mg | DELAYED_RELEASE_TABLET | Freq: Two times a day (BID) | ORAL | Status: AC

## 2013-03-20 NOTE — Progress Notes (Signed)
Physical Therapy Treatment Patient Details Name: Lori Ramirez MRN: [id number] DOB: 1931-02-25 Today's Date: 03/20/2013 Time: 1138-1207 PT Time Calculation (min): 29 min  PT Assessment / Plan / Recommendation  History of Present Illness s/p L DATHA on 03/19/13   PT Comments   Pt continues to be nauseated. Wants to try a little lunch. Will see again this PM and practice steps  And decide if pt is ready for DC. Pt would benefit from overnight stay due to nausea and has 16 steps to bed/bath. Pt currently declines a hospital bed.  Follow Up Recommendations  Home health PT     Does the patient have the potential to tolerate intense rehabilitation     Barriers to Discharge        Equipment Recommendations  None recommended by PT (discussed need for hospital bed if steps are too difficult. )    Recommendations for Other Services    Frequency 7X/week   Progress towards PT Goals Progress towards PT goals: Progressing toward goals  Plan Current plan remains appropriate    Precautions / Restrictions Precautions Precautions: Fall Restrictions Weight Bearing Restrictions: No   Pertinent Vitals/Pain     Mobility  Bed Mobility Bed Mobility: Supine to Sit;Sitting - Scoot to Edge of Bed Supine to Sit: 4: Min assist Sitting - Scoot to Edge of Bed: 4: Min guard Details for Bed Mobility Assistance: cues for technique. Transfers Transfers: Sit to Stand;Stand to Sit Sit to Stand: From toilet;With upper extremity assist;From chair/3-in-1;4: Min guard Stand to Sit: To chair/3-in-1;To toilet;4: Min guard Details for Transfer Assistance: cues for reaching back to armrests., LLE position. Ambulation/Gait Ambulation/Gait Assistance: 4: Min assist Ambulation Distance (Feet): 150 Feet Assistive device: Rolling walker Ambulation/Gait Assistance Details: pt moving better, cues to stay inside RW Gait Pattern: Step-through pattern;Decreased step length - left;Trunk flexed    Exercises     PT  Diagnosis: Difficulty walking  PT Problem List: Decreased strength;Decreased activity tolerance;Decreased range of motion;Decreased mobility PT Treatment Interventions: DME instruction;Gait training;Stair training;Functional mobility training;Therapeutic activities;Therapeutic exercise;Patient/family education   PT Goals (current goals can now be found in the care plan section) Acute Rehab PT Goals Patient Stated Goal: to go home today. PT Goal Formulation: With patient/family Time For Goal Achievement: 03/27/13 Potential to Achieve Goals: Good  Visit Information  Last PT Received On: 03/20/13 Assistance Needed: +1 History of Present Illness: s/p L DATHA on 03/19/13    Subjective Data  Patient Stated Goal: to go home today.   Cognition  Cognition Arousal/Alertness: Awake/alert Behavior During Therapy: WFL for tasks assessed/performed Overall Cognitive Status: Within Functional Limits for tasks assessed    Balance  Balance Balance Assessed: Yes Dynamic Standing Balance Dynamic Standing - Level of Assistance: 4: Min assist  End of Session PT - End of Session Activity Tolerance: Patient tolerated treatment well Patient left: in chair;with call bell/phone within reach;with family/visitor present Nurse Communication: Mobility status (nausea.)   GP     Rada Hay 03/20/2013, 1:04 PM Blanchard Kelch PT [phone]

## 2013-03-20 NOTE — Progress Notes (Signed)
Physical Therapy Treatment Patient Details Name: Lori Ramirez MRN: [id number] DOB: July 16, 1931 Today's Date: 03/20/2013 Time: 1426-1510 PT Time Calculation (min): 44 min  PT Assessment / Plan / Recommendation  History of Present Illness s/p L DATHA on 03/19/13   PT Comments   Pt feels better ,did practice 5 steps. Pt has 16 steps Pt will be much safer to practice mobility today and tonight and practice steps again in AM.  Follow Up Recommendations  Home health PT     Does the patient have the potential to tolerate intense rehabilitation     Barriers to Discharge        Equipment Recommendations  None recommended by PT    Recommendations for Other Services    Frequency 7X/week   Progress towards PT Goals Progress towards PT goals: Progressing toward goals  Plan Current plan remains appropriate    Precautions / Restrictions Precautions Precautions: Fall Restrictions Weight Bearing Restrictions: No   Pertinent Vitals/Pain Pt states thigh is sore.    Mobility  Bed Mobility Bed Mobility: Sit to Supine Supine to Sit: 4: Min assist Sitting - Scoot to Edge of Bed: 4: Min guard Sit to Supine: 4: Min guard Details for Bed Mobility Assistance: pt able to place LLE onto bed. Transfers Sit to Stand: From toilet;With upper extremity assist;From chair/3-in-1;5: Supervision Stand to Sit: 5: Supervision;With armrests;With upper extremity assist;To bed;To toilet Details for Transfer Assistance: , LLE position. Ambulation/Gait Ambulation/Gait Assistance: 4: Min guard Ambulation Distance (Feet): 150 Feet Assistive device: Rolling walker Ambulation/Gait Assistance Details: pt moving better, cues to stay inside RW Gait Pattern: Step-through pattern;Decreased step length - left;Trunk flexed Stairs: Yes Stairs Assistance: 4: Min assist Stair Management Technique: One rail Left;With cane Number of Stairs: 5    Exercises Total Joint Exercises Ankle Circles/Pumps: AROM;Both;10  reps Quad Sets: AROM;Both;10 reps Short Arc Quad: AROM;Left;10 reps Heel Slides: AAROM;Left;10 reps Hip ABduction/ADduction: AAROM;Left;10 reps   PT Diagnosis:    PT Problem List:   PT Treatment Interventions:     PT Goals (current goals can now be found in the care plan section) Acute Rehab PT Goals Patient Stated Goal: to go home today.  Visit Information  Last PT Received On: 03/20/13 Assistance Needed: +1 History of Present Illness: s/p L DATHA on 03/19/13    Subjective Data  Patient Stated Goal: to go home today.   Cognition  Cognition Arousal/Alertness: Awake/alert Behavior During Therapy: WFL for tasks assessed/performed Overall Cognitive Status: Within Functional Limits for tasks assessed    Balance  Balance Balance Assessed: Yes Dynamic Standing Balance Dynamic Standing - Level of Assistance: 4: Min assist  End of Session PT - End of Session Activity Tolerance: Patient tolerated treatment well Patient left: with call bell/phone within reach;with family/visitor present;in bed Nurse Communication: Mobility status;Patient requests pain meds   GP     Rada Hay 03/20/2013, 3:19 PM Blanchard Kelch PT [phone]

## 2013-03-20 NOTE — Progress Notes (Signed)
   Subjective: 1 Day Post-Op Procedure(s) (LRB): LEFT TOTAL HIP ARTHROPLASTY ANTERIOR APPROACH (Left)   Patient reports pain as mild, pain well controlled. C/o back pain, but no left hip pain. She feels that once she gets up her back will feel a lot better. No other events throughout the night. Ready to be discharged home if she does well with PT and pain controlled.   Objective:   VITALS:   Filed Vitals:   03/20/13 1026  BP: 120/68  Pulse: 70  Temp: 98.1 F (36.7 C)  Resp: 18    Neurovascular intact Dorsiflexion/Plantar flexion intact Incision: dressing C/D/I No cellulitis present Compartment soft  LABS  Recent Labs  03/20/13 0445  HGB 12.3  HCT 35.2*  WBC 5.6  PLT 167     Recent Labs  03/20/13 0445  NA 134*  K 4.3  BUN 18  CREATININE 0.60  GLUCOSE 152*     Assessment/Plan: 1 Day Post-Op Procedure(s) (LRB): LEFT TOTAL HIP ARTHROPLASTY ANTERIOR APPROACH (Left) Foley cath d/c'ed Advance diet Up with therapy D/C IV fluids Discharge home with home health Follow up in 2 weeks at [facility]. Follow up with OLIN,Beauregard Jarrells D in 2 weeks.  Contact information:  [facility] [address], [address] [place] [address] [phone]         [name]. Smitty Ackerley   PAC  03/20/2013, 10:45 AM

## 2013-03-20 NOTE — Evaluation (Signed)
Occupational Therapy Evaluation Patient Details Name: Lori Ramirez MRN: [id number] DOB: 03/19/1931 Today's Date: 03/20/2013 Time: 4098-1191 OT Time Calculation (min): 27 min  OT Assessment / Plan / Recommendation History of present illness s/p L DATHA on 03/19/13   Clinical Impression   Pt wanting to d/c home today but feel she will benefit from an additional day of practice with ADL/caregiver education to improve safety and independence with these tasks.    OT Assessment  Patient needs continued OT Services    Follow Up Recommendations  Supervision/Assistance - 24 hour;No OT follow up    Barriers to Discharge      Equipment Recommendations  None recommended by OT    Recommendations for Other Services    Frequency  Min 2X/week    Precautions / Restrictions Precautions Precautions: Fall Restrictions Weight Bearing Restrictions: No   Pertinent Vitals/Pain 2/10 L hip; reposition and ice    ADL  Eating/Feeding: Simulated;Independent Where Assessed - Eating/Feeding: Chair Grooming: Simulated;Wash/dry hands;Set up Where Assessed - Grooming: Supported sitting Upper Body Bathing: Simulated;Chest;Right arm;Left arm;Abdomen;Set up Where Assessed - Upper Body Bathing: Unsupported sitting Lower Body Bathing: Simulated;Moderate assistance Where Assessed - Lower Body Bathing: Supported sit to stand Upper Body Dressing: Simulated;Set up Where Assessed - Upper Body Dressing: Unsupported sitting Lower Body Dressing: Simulated;Maximal assistance Where Assessed - Lower Body Dressing: Supported sit to stand Toilet Transfer: Performed;Minimal assistance Acupuncturist: Comfort height toilet;Grab bars Toileting - Architect and Hygiene: Performed;Minimal assistance Where Assessed - Engineer, mining and Hygiene: Sit to stand from 3-in-1 or toilet Equipment Used: Rolling walker ADL Comments: Pt not wanting to use 3in1 at home but discussed for safety  use of 3in1 for UE support as pt only has a vanity in front of the toilet at home. Pt verbalized understanding. She plans to use 3in1 as shower seat. Husband present and confirm he will be helping with LB ADL as he did PTA. Pt anxious to d/c home but feel she will benefit from an extra day to work on Designer, industrial/product education. Pt was slightly dizzy with initial OOB activity.    OT Diagnosis: Generalized weakness  OT Problem List: Decreased strength;Decreased knowledge of use of DME or AE OT Treatment Interventions: Self-care/ADL training;DME and/or AE instruction;Patient/family education;Therapeutic activities   OT Goals(Current goals can be found in the care plan section) Acute Rehab OT Goals Patient Stated Goal: to go home today. OT Goal Formulation: With patient/family Time For Goal Achievement: 03/27/13 Potential to Achieve Goals: Good  Visit Information  Last OT Received On: 03/20/13 Assistance Needed: +1 History of Present Illness: s/p L DATHA on 03/19/13       Prior Functioning     Home Living Family/patient expects to be discharged to:: Private residence Living Arrangements: Spouse/significant other Available Help at Discharge: Family Type of Home: House Home Access: Stairs to enter Secretary/administrator of Steps: 5 Entrance Stairs-Rails: Right;Left Home Layout: Two level Alternate Level Stairs-Number of Steps: 13 Alternate Level Stairs-Rails: Right Home Equipment: Walker - 2 wheels;Bedside commode Prior Function Level of Independence: Needs assistance Gait / Transfers Assistance Needed: spouse assists as needed on steps ADL's / Homemaking Assistance Needed: spouse assists Communication Communication: No difficulties         Vision/Perception     Cognition  Cognition Arousal/Alertness: Awake/alert Behavior During Therapy: WFL for tasks assessed/performed Overall Cognitive Status: Within Functional Limits for tasks assessed    Extremity/Trunk  Assessment Upper Extremity Assessment Upper Extremity Assessment: LUE deficits/detail;RUE deficits/detail RUE Deficits /  Details: tremors noted that pt states "was not like this before" Grossly WFL for strength and ROM LUE Deficits / Details: noted tremors that pt states was not like this before. grossly WFL for activities strength, ROM  Cervical / Trunk Assessment Cervical / Trunk Assessment:  (scoloitic)     Mobility Bed Mobility Bed Mobility: Supine to Sit;Sitting - Scoot to Edge of Bed Supine to Sit: 4: Min assist Sitting - Scoot to Edge of Bed: 4: Min guard Details for Bed Mobility Assistance: cues for technique. Transfers Transfers: Sit to Stand;Stand to Sit Sit to Stand: From bed;From toilet;With upper extremity assist;4: Min assist Stand to Sit: To chair/3-in-1;To toilet;With upper extremity assist;4: Min assist Details for Transfer Assistance: cues for reaching back to armrests., LLE position.     Exercise     Balance Balance Balance Assessed: Yes Dynamic Standing Balance Dynamic Standing - Level of Assistance: 4: Min assist   End of Session OT - End of Session Activity Tolerance: Patient tolerated treatment well (a little dizzy initially OOB) Patient left: in chair;with call bell/phone within reach;with family/visitor present  GO     Lennox Laity [phone] 03/20/2013, 12:02 PM

## 2013-03-20 NOTE — Evaluation (Signed)
Physical Therapy Evaluation Patient Details Name: Lori Ramirez MRN: [id number] DOB: 10/05/1930 Today's Date: 03/20/2013 Time: 4098-1191 PT Time Calculation (min): 29 min  PT Assessment / Plan / Recommendation History of Present Illness  s/p L DATHA on 03/19/13  Clinical Impression  Pt states that her tremors of her hands and arms is worsened. Pt c/o feeling dizzy uppon getting up which subsided the more she was up. Pt did c/o nausea waves also. Pt has 13 steps to her bed/bath so will need to be able to tolerate without dizziness. Pt may benefit from staying until tomorrow to ensure safety.     PT Assessment  Patient needs continued PT services    Follow Up Recommendations  Home health PT    Does the patient have the potential to tolerate intense rehabilitation      Barriers to Discharge        Equipment Recommendations  None recommended by PT    Recommendations for Other Services     Frequency 7X/week    Precautions / Restrictions Restrictions Weight Bearing Restrictions: No   Pertinent Vitals/Pain Pt does not c/o pain in Hip, only in her back from being in bed per pt.      Mobility  Bed Mobility Bed Mobility: Supine to Sit;Sitting - Scoot to Edge of Bed Supine to Sit: 4: Min assist Sitting - Scoot to Edge of Bed: 4: Min guard Details for Bed Mobility Assistance: cues for technique. Transfers Transfers: Sit to Stand;Stand to Sit Sit to Stand: From bed;From toilet;With upper extremity assist Stand to Sit: To chair/3-in-1;To toilet;With upper extremity assist Details for Transfer Assistance: cues for reaching back to armrests., LLE position. Ambulation/Gait Ambulation/Gait Assistance: 4: Min assist Ambulation Distance (Feet): 150 Feet Assistive device: Rolling walker Ambulation/Gait Assistance Details: cues for sequence, staying inside of  RW Gait Pattern: Step-through pattern;Decreased step length - left;Trunk flexed    Exercises     PT Diagnosis: Difficulty  walking  PT Problem List: Decreased strength;Decreased activity tolerance;Decreased range of motion;Decreased mobility PT Treatment Interventions: DME instruction;Gait training;Stair training;Functional mobility training;Therapeutic activities;Therapeutic exercise;Patient/family education     PT Goals(Current goals can be found in the care plan section) Acute Rehab PT Goals Patient Stated Goal: to go home today. PT Goal Formulation: With patient/family Time For Goal Achievement: 03/27/13 Potential to Achieve Goals: Good  Visit Information  Last PT Received On: 03/20/13 Assistance Needed: +1 History of Present Illness: s/p L DATHA on 03/19/13       Prior Functioning  Home Living Family/patient expects to be discharged to:: Private residence Living Arrangements: Spouse/significant other Available Help at Discharge: Family Type of Home: House Home Access: Stairs to enter Secretary/administrator of Steps: 5 Entrance Stairs-Rails: Right;Left Home Layout: Two level Alternate Level Stairs-Number of Steps: 13 Alternate Level Stairs-Rails: Right Home Equipment: Walker - 2 wheels;Bedside commode Prior Function Level of Independence: Needs assistance Gait / Transfers Assistance Needed: spouse assists as needed on steps ADL's / Homemaking Assistance Needed: spouse assists Communication Communication: No difficulties    Cognition  Cognition Arousal/Alertness: Awake/alert Behavior During Therapy: WFL for tasks assessed/performed Overall Cognitive Status: Within Functional Limits for tasks assessed    Extremity/Trunk Assessment Lower Extremity Assessment Lower Extremity Assessment: LLE deficits/detail LLE Deficits / Details: Pt required some assistance to move LLE to edge, able to advance LLE Cervical / Trunk Assessment Cervical / Trunk Assessment:  (scoloitic)   Balance    End of Session PT - End of Session Activity Tolerance: Patient tolerated treatment well  Patient left: in  chair;with call bell/phone within reach;with family/visitor present Nurse Communication: Mobility status  GP     Rada Hay 03/20/2013, 11:00 AM Blanchard Kelch PT [phone]

## 2013-03-20 NOTE — Progress Notes (Signed)
Utilization review completed.  

## 2013-03-20 NOTE — Care Management Note (Signed)
    Page 1 of 1   03/20/2013     12:57:21 PM   CARE MANAGEMENT NOTE 03/20/2013  Patient:  Lori Ramirez, Lori Ramirez   Account Number:  [id number]  Date Initiated:  03/20/2013  Documentation initiated by:  Colleen Can  Subjective/Objective Assessment:   dx left hip osteoarthritis ; total hip replacemnt -anterior approach.  Genevieve Norlander will provide HHpt services day after pt is discharged.     Action/Plan:   CM spoke with patient and spouse. Plans are for patient to return to her home in [facility] where spouse will be caregiver. She already has RW and commode seat. States Genevieve Norlander will provide HHpt services.   Anticipated DC Date:  03/20/2013   Anticipated DC Plan:  HOME W HOME HEALTH SERVICES      DC Planning Services  CM consult      [address] [facility] Choice  HOME HEALTH   Choice offered to / List presented to:  C-1 Patient        HH arranged  HH-2 PT      [facility] agency  [facility]   Status of service:  Completed, signed off Medicare Important Message given?  NA - LOS <3 / Initial given by admissions (If response is "NO", the following Medicare IM given date fields will be blank) Date Medicare IM given:   Date Additional Medicare IM given:    Discharge Disposition:    Per UR Regulation:    If discussed at Long Length of Stay Meetings, dates discussed:    Comments:

## 2013-03-21 ENCOUNTER — Encounter (HOSPITAL_COMMUNITY): Payer: Self-pay | Admitting: Orthopedic Surgery

## 2013-03-21 LAB — BASIC METABOLIC PANEL
BUN: 13 mg/dL (ref 6–23)
CO2: 27 mEq/L (ref 19–32)
Calcium: 9.3 mg/dL (ref 8.4–10.5)
Creatinine, Ser: 0.66 mg/dL (ref 0.50–1.10)
GFR calc Af Amer: >90 mL/min (ref >90)

## 2013-03-21 LAB — CBC
HCT: 33.4 % — ABNORMAL LOW (ref 36.0–46.0)
MCH: 32.4 pg (ref 26.0–34.0)
MCV: 94.1 fL (ref 78.0–100.0)
Platelets: 154 10*3/uL (ref 150–400)
RDW: 13.4 % (ref 11.5–15.5)

## 2013-03-21 MED ORDER — TIZANIDINE HCL 4 MG PO CAPS: 4.0000 mg | ORAL_CAPSULE | Freq: Three times a day (TID) | ORAL | Status: DC | PRN

## 2013-03-21 NOTE — Progress Notes (Signed)
   Subjective: 2 Days Post-Op Procedure(s) (LRB): LEFT TOTAL HIP ARTHROPLASTY ANTERIOR APPROACH (Left)   Patient reports pain as mild, pain well controlled.  Less nausea today, now that she is off Norco and only taking APAP for pain.  Objective:   VITALS:   Filed Vitals:   03/21/13 0450  BP: 159/72  Pulse: 68  Temp: 98.6 F (37 C)  Resp: 20    Neurovascular intact Dorsiflexion/Plantar flexion intact Incision: dressing C/D/I No cellulitis present Compartment soft  LABS  Recent Labs  03/20/13 0445 03/21/13 0448  HGB 12.3 11.5*  HCT 35.2* 33.4*  WBC 5.6 5.8  PLT 167 154     Recent Labs  03/20/13 0445 03/21/13 0448  NA 134* 134*  K 4.3 3.7  BUN 18 13  CREATININE 0.60 0.66  GLUCOSE 152* 117*     Assessment/Plan: 2 Days Post-Op Procedure(s) (LRB): LEFT TOTAL HIP ARTHROPLASTY ANTERIOR APPROACH (Left) Up with therapy Discharge home with home health Follow up in 2 weeks at [name]. Follow up with OLIN,[name] in 2 weeks.  Contact information:  [name] [address] [place] [address] [phone]        Lori Ramirez. Lori Ramirez   PAC  03/21/2013, 7:35 AM

## 2013-03-21 NOTE — Progress Notes (Signed)
Discharge summary sent to payer through [employer]  

## 2013-03-21 NOTE — Progress Notes (Signed)
Occupational Therapy Treatment Patient Details Name: Lori Ramirez MRN: [id number] DOB: October 25, 1930 Today's Date: 03/21/2013 Time: 0865-7846 OT Time Calculation (min): 27 min  OT Assessment / Plan / Recommendation  History of present illness s/p L DATHA on 03/19/13   OT comments  Pt overall min assist with functional toilet transfer and shower transfer but did have a LOB posteriorly in the bathroom as she was turning to exit bathroom with walker. She required mod assist to recover balance. Discussed importance of spouse being right there with her for all ADL and functional mobility initially for safety. Discussed strategies to increase safety and how husband can provide steady support for balance during ADL. Pt to d/c today. They feel they do not need HHOT at d/c.    Follow Up Recommendations  Supervision/Assistance - 24 hour;No OT follow up    Barriers to Discharge       Equipment Recommendations  3 in 1 bedside comode (her commode doesnt have a bucket)    Recommendations for Other Services    Frequency Min 2X/week   Progress towards OT Goals Progress towards OT goals: Progressing toward goals  Plan      Precautions / Restrictions Precautions Precautions: Fall Restrictions Weight Bearing Restrictions: No   Pertinent Vitals/Pain 5/10 L hip, reposition, ice    ADL  Toilet Transfer: Performed;Minimal assistance;Moderate assistance Toilet Transfer Method: Other (comment) (with walker) Toilet Transfer Equipment: Raised toilet seat with arms (or 3-in-1 over toilet) Toileting - Clothing Manipulation and Hygiene: Performed;Minimal assistance Where Assessed - Toileting Clothing Manipulation and Hygiene: Sit to stand from 3-in-1 or toilet Tub/Shower Transfer: Performed;Minimal assistance Tub/Shower Transfer Method:  (step back over ledge) Equipment Used: Rolling walker ADL Comments: Husband present for session. Pt needs frequent verbal cues to stay close to walker and for hand  placement. She tends to try to sit down without reaching back for armrest of chair. Discussed how to adjust 3in1 for appropriate height with pt and husband. Pt did well wtih sitting and standing from 3in1 but as she stepped away from toilet with her walker she had a LOB requiring mod assist to recover her balance. Discussed safety precautions of making sure husband stands right there with her with his arm around her back to support her as she manages clothes, etc. Also discussed holding walker with one hand and pulling up clothes with the other and then alternating hand hold until pt is more steady to let go of walker with both hands. Husband states he will be right there helping her. She practiced shower transfer with husband holding RW in front to steady walker but pt requires min assist to step over ledge backwards for balance support. Discussed waiting on showering initially and sponging for at least a few days to regain more strength and improve balance hopefully before she tries stepping into shower. Pt and husband verbalize understanding. Discussed use of 3in1 as shower seat when she does shower.    OT Diagnosis:    OT Problem List:   OT Treatment Interventions:     OT Goals(current goals can now be found in the care plan section) Acute Rehab OT Goals Patient Stated Goal: to go home today.  Visit Information  Last OT Received On: 03/21/13 Assistance Needed: +1 History of Present Illness: s/p L DATHA on 03/19/13    Subjective Data      Prior Functioning       Cognition  Cognition Arousal/Alertness: Awake/alert    Mobility  Transfers Transfers: Sit to Stand;Stand  to Sit Sit to Stand: 4: Min assist;With upper extremity assist;From chair/3-in-1 Stand to Sit: 4: Min assist;With upper extremity assist;To chair/3-in-1 Details for Transfer Assistance: cues for hand placement    Exercises      Balance Balance Balance Assessed: Yes Dynamic Standing Balance Dynamic Standing - Balance  Support: No upper extremity supported Dynamic Standing - Level of Assistance: 4: Min assist;3: Mod assist (one LOB requiring mod assist to recover. min assist otherwis)   End of Session OT - End of Session Activity Tolerance: Patient tolerated treatment well Patient left: in chair;with call bell/phone within reach;with family/visitor present  GO     Lennox Laity [phone] 03/21/2013, 11:32 AM

## 2013-03-21 NOTE — Progress Notes (Signed)
Physical Therapy Treatment Patient Details Name: Lori Ramirez MRN: [id number] DOB: Dec 22, 1930 Today's Date: 03/21/2013 Time: 4098-1191 PT Time Calculation (min): 32 min  PT Assessment / Plan / Recommendation  History of Present Illness s/p L DATHA on 03/19/13   PT Comments   Pt practiced steps again. Spouse aware of safety . Pt ready for DC.  Follow Up Recommendations  Home health PT     Does the patient have the potential to tolerate intense rehabilitation     Barriers to Discharge        Equipment Recommendations  3in1 (PT)    Recommendations for Other Services    Frequency     Progress towards PT Goals Progress towards PT goals: Progressing toward goals  Plan Current plan remains appropriate    Precautions / Restrictions Precautions Precautions: Fall   Pertinent Vitals/Pain States ant. Thigh is sore.   Mobility  Transfers Sit to Stand: From bed;With upper extremity assist Stand to Sit: To chair/3-in-1;Without upper extremity assist Details for Transfer Assistance: cues for UE position to lower to recliner, push from bed. Ambulation/Gait Ambulation/Gait Assistance: 4: Min guard Ambulation Distance (Feet): 150 Feet Assistive device: Rolling walker Ambulation/Gait Assistance Details: cues for posture, RW lowered and appeasrs to allow pt to have better position and gait sequence. Gait Pattern: Step-through pattern;Decreased step length - left;Trunk flexed Stairs: Yes Stairs Assistance: 4: Min assist Stairs Assistance Details (indicate cue type and reason): step by tep cues from spouse on sequence. Spouse instructed to be infront going down and behind goung up. Number of Stairs: 5    Exercises     PT Diagnosis:    PT Problem List:   PT Treatment Interventions:     PT Goals (current goals can now be found in the care plan section)    Visit Information  Last PT Received On: 03/21/13 Assistance Needed: +1 History of Present Illness: s/p L DATHA on 03/19/13     Subjective Data      Cognition  Cognition Arousal/Alertness: Awake/alert    Balance     End of Session PT - End of Session Activity Tolerance: Patient tolerated treatment well Patient left: with call bell/phone within reach;with family/visitor present;in chair Nurse Communication: Mobility status   GP     Rada Hay 03/21/2013, 10:01 AM

## 2013-03-27 NOTE — Discharge Summary (Signed)
Physician Discharge Summary  Patient ID: Lori Ramirez MRN: [id number] DOB/AGE: 03/12/1931 79 y.o.  Admit date: 03/19/2013 Discharge date: 03/21/2013   Procedures:  Procedure(s) (LRB): LEFT TOTAL HIP ARTHROPLASTY ANTERIOR APPROACH (Left)  Attending Physician:  Dr. Durene Romans   Admission Diagnoses:   Left hip OA / pain  Discharge Diagnoses:  Principal Problem:   S/P left THA, AA  Past Medical History  Diagnosis Date  . Arthritis   . Osteoarthritis of left hip     HPI: Lori Ramirez, 79 y.o. female, has a history of pain and functional disability in the left hip(s) due to arthritis and patient has failed non-surgical conservative treatments for greater than 12 weeks to include NSAID's and/or analgesics, use of assistive devices and activity modification. Onset of symptoms was gradual starting years ago with rapidlly worsening for the last 3 months.The patient noted no past surgery on the left hip(s). Patient currently rates pain in the left hip at 6 out of 10 with activity. Patient has worsening of pain with activity and weight bearing, trendelenberg gait, pain that interfers with activities of daily living and pain with passive range of motion. Patient has evidence of periarticular osteophytes and joint space narrowing by imaging studies. This condition presents safety issues increasing the risk of falls. There is no current active infection. Risks, benefits and expectations were discussed with the patient. Patient understand the risks, benefits and expectations and wishes to proceed with surgery.   PCP: [name]., DO   Discharged Condition: good  Hospital Course:  Patient underwent the above stated procedure on 03/19/2013. Patient tolerated the procedure well and brought to the recovery room in good condition and subsequently to the floor.  POD #1 BP: 120/68 ; Pulse: 70 ; Temp: 98.1 F (36.7 C) ; Resp: 18  Pt's foley was removed, as well as the hemovac drain removed. IV  was changed to a saline lock. Patient reports pain as mild, pain well controlled. C/o back pain, but no left hip pain. She feels that once she gets up her back will feel a lot better. No other events throughout the night.  Neurovascular intact, dorsiflexion/plantar flexion intact, incision: dressing C/D/I, no cellulitis present and compartment soft.   LABS  Basename    HGB  12.3  HCT  35.2   POD #2  BP: 159/72 ; Pulse: 68 ; Temp: 98.6 F (37 C) ; Resp: 20 Patient reports pain as mild, pain well controlled. Less nausea today, now that she is off Norco and only taking APAP for pain.  Ready to be discharged home. Neurovascular intact, dorsiflexion/plantar flexion intact, incision: dressing C/D/I, no cellulitis present and compartment soft.   LABS  Basename    HGB  11.5  HCT  33.4    Discharge Exam: General appearance: alert, cooperative and no distress Extremities: Homans sign is negative, no sign of DVT, no edema, redness or tenderness in the calves or thighs and no ulcers, gangrene or trophic changes  Disposition:   Home-Health Care Svc with follow up in 2 weeks   Follow-up Information   Follow up with [name], MD. Schedule an appointment as soon as possible for a visit in 2 weeks.   Specialty:  Orthopedic Surgery   Contact information:   [address] [place] [address] [phone]       Discharge Orders   Future Appointments Provider Department Dept Phone   12/27/2013 8:15 AM [name], DO [facility]  [phone]   Future Orders Complete By Expires   Call MD / Call 911  As directed    Comments:     If you experience chest pain or shortness of breath, CALL 911 and be transported to the hospital emergency room.  If you develope a fever above 101 F, pus (white drainage) or increased drainage or redness at the wound, or calf pain, call your surgeon's office.   Change dressing  As directed    Comments:     Maintain surgical  dressing for 10-14 days, then replace with 4x4 guaze and tape. Keep the area dry and clean.   Constipation Prevention  As directed    Comments:     Drink plenty of fluids.  Prune juice may be helpful.  You may use a stool softener, such as Colace (over the counter) 100 mg twice a day.  Use MiraLax (over the counter) for constipation as needed.   Diet - low sodium heart healthy  As directed    Discharge instructions  As directed    Comments:     Maintain surgical dressing for 10-14 days, then replace with gauze and tape. Keep the area dry and clean until follow up. Follow up in 2 weeks at [facility]. Call with any questions or concerns.   Increase activity slowly as tolerated  As directed    TED hose  As directed    Comments:     Use stockings (TED hose) for 2 weeks on both leg(s).  You may remove them at night for sleeping.   Weight bearing as tolerated  As directed         Medication List    STOP taking these medications       aspirin 81 MG tablet  Replaced by:  aspirin 325 MG EC tablet     ibuprofen 200 MG tablet  Commonly known as:  ADVIL,MOTRIN      TAKE these medications       acetaminophen 500 MG tablet  Commonly known as:  TYLENOL  Take 500 mg by mouth every 6 (six) hours as needed for pain.     aspirin 325 MG EC tablet  Take 1 tablet (325 mg total) by mouth 2 (two) times daily.     calcium gluconate 500 MG tablet  Take 500 mg by mouth daily.     DSS 100 MG Caps  Take 100 mg by mouth 2 (two) times daily.     ferrous sulfate 325 (65 FE) MG tablet  Take 1 tablet (325 mg total) by mouth 3 (three) times daily after meals.     GLUCOSAMINE 1500 COMPLEX PO  Take 1 tablet by mouth daily.     ONE-A-DAY WOMENS 50 PLUS PO  Take by mouth.     OVER THE COUNTER MEDICATION  Pt takes 1 a day stool softener  Pt does not know name     polyethylene glycol packet  Commonly known as:  MIRALAX / GLYCOLAX  Take 17 g by mouth 2 (two) times daily.     tiZANidine  4 MG capsule  Commonly known as:  ZANAFLEX  Take 1 capsule (4 mg total) by mouth 3 (three) times daily as needed for muscle spasms.         Signed: [name]. Sadie Pickar   PAC  03/27/2013, 1:53 PM

## 2013-05-30 ENCOUNTER — Other Ambulatory Visit: Payer: Self-pay

## 2013-12-27 ENCOUNTER — Ambulatory Visit (INDEPENDENT_AMBULATORY_CARE_PROVIDER_SITE_OTHER): Payer: Medicare PPO | Admitting: Family Medicine

## 2013-12-27 ENCOUNTER — Encounter: Payer: Self-pay | Admitting: Family Medicine

## 2013-12-27 VITALS — BP 142/80 | HR 93 | Temp 97.9°F | Ht 61.25 in | Wt 171.0 lb

## 2013-12-27 DIAGNOSIS — Z0000 Encounter for general adult medical examination without abnormal findings: Secondary | ICD-10-CM

## 2013-12-27 NOTE — Progress Notes (Signed)
Medicare Annual Preventive Care Visit  (initial annual wellness or annual wellness exam)  1.) Patient-completed health risk assessment  - completed and reviewed, see scanned documentation  2.) Review of Medical History: -PMH, PSH, Family History and current specialty and care providers reviewed and updated and listed below  - see chart and below  3.) Review of functional ability and level of safety:  Any difficulty hearing?  NO  History of falling? NO  Any trouble with IADLs - using a phone, using transportation, grocery shopping, preparing meals, doing housework, doing laundry, taking medications and managing money? NO  Advance Directives? YES  See summary of recommendations in Patient Instructions below.  4.) Physical Exam Filed Vitals:   12/27/13 0814  BP: 142/80  Pulse: 93  Temp: 97.9 F (36.6 C)   Estimated body mass index is 32.04 kg/(m^2) as calculated from the following:   Height as of this encounter: 5' 1.25" (1.556 m).   Weight as of this encounter: 171 lb (77.565 kg).  Visual Acuity grossly intact  Mini Cog: 1. Patient instructed to listen carefully and repeat the following: Apple Watch    Penny  2. Clock drawing test was administered: NORMAL       3. Recall of three words 3/3  Scoring:  Patient Score: NEG  See scanned sheet.  See patient instructions for recommendations.  4)The following written screening schedule of preventive measures were reviewed with assessment and plan made per below, orders and patient instructions:      AAA screening: N/A     Alcohol screening: done     Obesity Screening and counseling: done     STI screening: offered, declined     Tobacco Screening: done       Pneumococcal (PPSV23 -one dose after 64, one before if risk factors), influenza yearly and hepatitis B vaccines (if high risk - end stage renal disease, IV drugs, homosexual men, live in home for mentally retarded, hemophilia receiving  factors) ASSESSMENT/PLAN: she has had ppsv23 - declined prevnar 13      Screening mammograph (yearly if >40) ASSESSMENT/PLAN: refused mammogram, she does do self breast exams      Screening Pap smear/pelvic exam (q2 years) ASSESSMENT/PLAN: N/A      Prostate cancer screening ASSESSMENT/PLAN: N/A      Colorectal cancer screening (FOBT yearly or flex sig q4y or colonoscopy q10y or barium enema q4y) ASSESSMENT/PLAN: declined - refused all forms of screening      Diabetes outpatient self-management training services ASSESSMENT/PLAN: N/A      Bone mass measurements(covered q2y if indicated - estrogen def, osteoporosis, hyperparathyroid, vertebral abnormalities, osteoporosis or steroids) ASSESSMENT/PLAN: declined      Screening for glaucoma(q1y if high risk - diabetes, FH, AA and > 50 or hispanic and > 65) ASSESSMENT/PLAN: sees eye doctor      Medical nutritional therapy for individuals with diabetes or renal disease ASSESSMENT/PLAN: N/A       Cardiovascular screening blood tests (lipids q5y) ASSESSMENT/PLAN: done last year      Diabetes screening tests ASSESSMENT/PLAN: done last year   7.) Summary: -risk factors and conditions per above assessment were discussed and treatment, recommendations and referrals were offered per documentation above and orders and patient instructions.  tdap and shingles vaccines offered - declined

## 2013-12-27 NOTE — Progress Notes (Signed)
Pre visit review using our clinic review tool, if applicable. No additional management support is needed unless otherwise documented below in the visit note. 

## 2013-12-27 NOTE — Patient Instructions (Addendum)
-  Vit D3 1000 IU daily - [facility] is ok; Calcium 1200mg  daily (probably need only 600mg  from supplement)      AAA screening: N/A     Alcohol screening: done     Obesity Screening and counseling: done     STI screening: offered, declined     Tobacco Screening: done       Pneumococcal (PPSV23 -one dose after 64, one before if risk factors), influenza yearly and hepatitis B vaccines (if high risk - end stage renal disease, IV drugs, homosexual men, live in home for mentally retarded, hemophilia receiving factors) ASSESSMENT/PLAN: she has had ppsv23 - declined prevnar 13      Screening mammograph (yearly if >40) ASSESSMENT/PLAN: refused mammogram, she does do self breast exams      Screening Pap smear/pelvic exam (q2 years) ASSESSMENT/PLAN: N/A      Prostate cancer screening ASSESSMENT/PLAN: N/A      Colorectal cancer screening (FOBT yearly or flex sig q4y or colonoscopy q10y or barium enema q4y) ASSESSMENT/PLAN: declined - refused all forms of screening      Diabetes outpatient self-management training services ASSESSMENT/PLAN: N/A      Bone mass measurements(covered q2y if indicated - estrogen def, osteoporosis, hyperparathyroid, vertebral abnormalities, osteoporosis or steroids) ASSESSMENT/PLAN: declined      Screening for glaucoma(q1y if high risk - diabetes, FH, AA and > 50 or hispanic and > 65) ASSESSMENT/PLAN: sees eye doctor      Medical nutritional therapy for individuals with diabetes or renal disease ASSESSMENT/PLAN: N/A       Cardiovascular screening blood tests (lipids q5y) ASSESSMENT/PLAN: done last year      Diabetes screening tests ASSESSMENT/PLAN: done last year

## 2014-04-22 IMAGING — CR DG PORTABLE PELVIS
1 series · 1 of 1 positions shown · non-contrast
Comparison: None.

CLINICAL DATA: Postop left hip.

PORTABLE PELVIS

[AP]
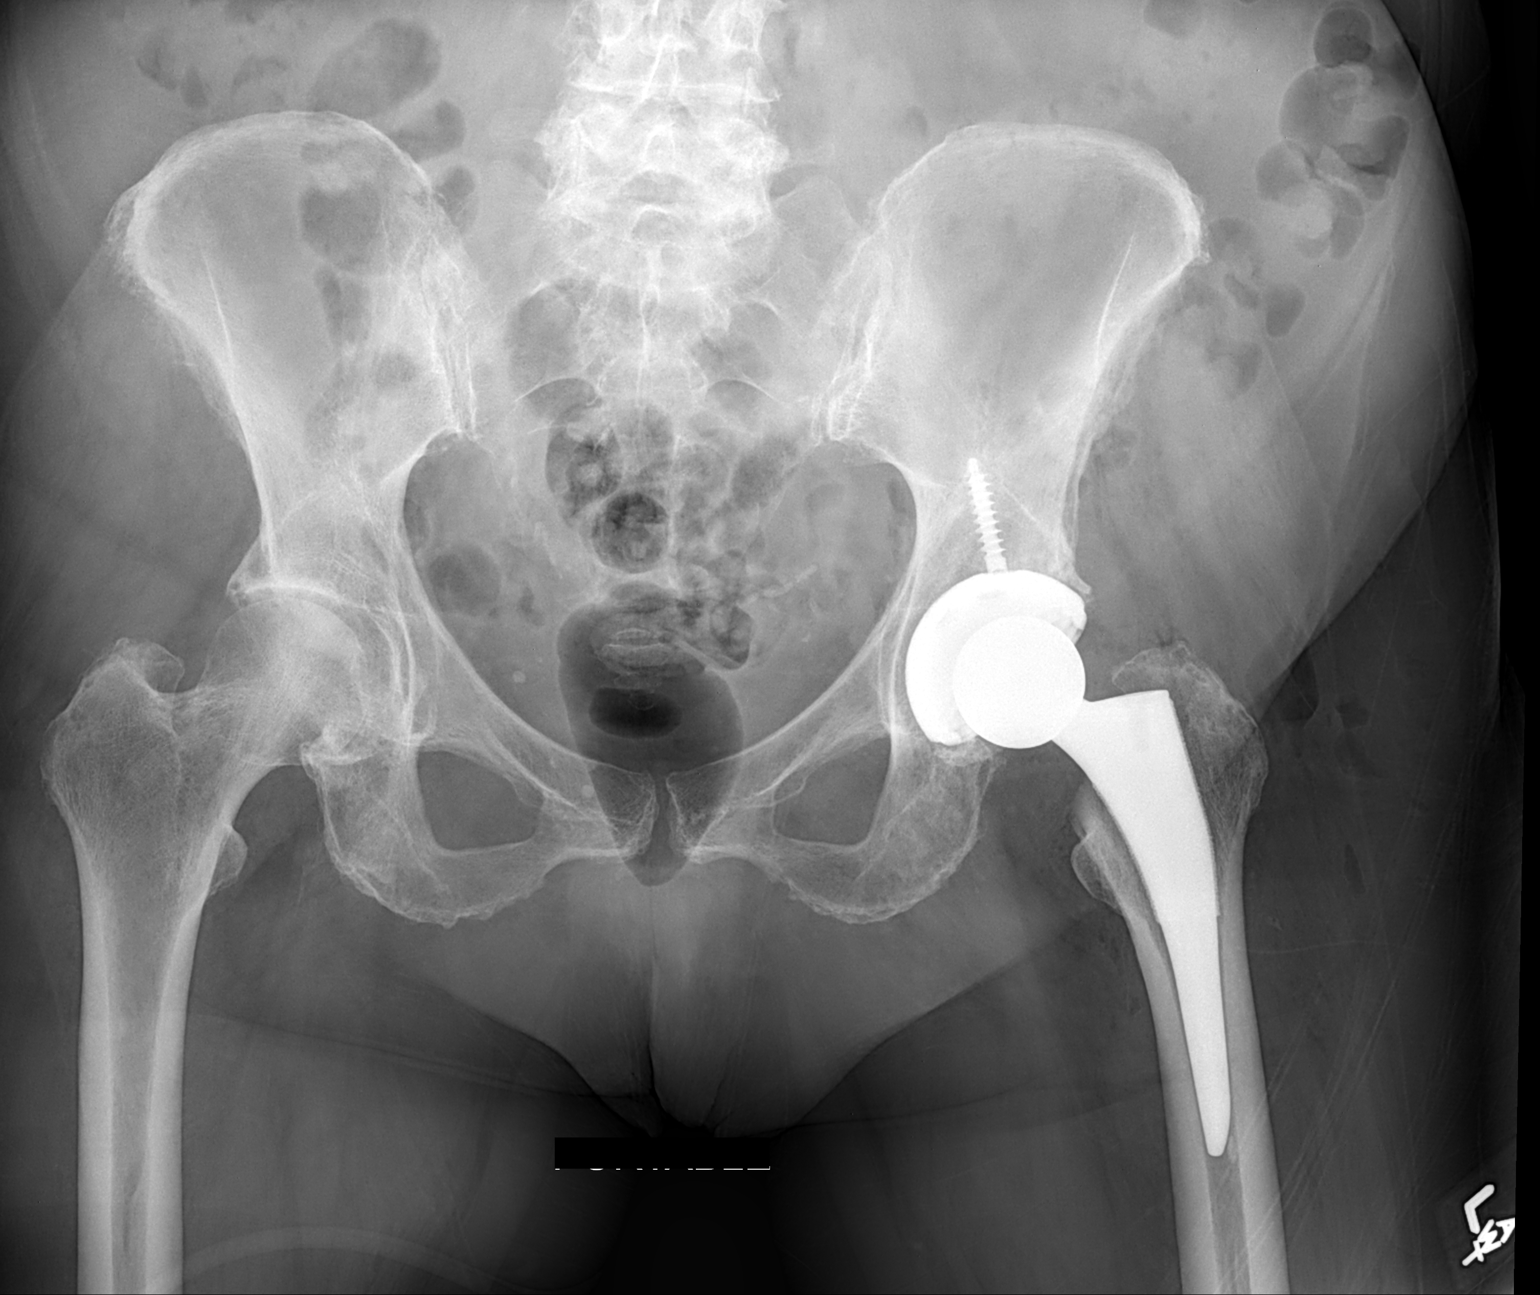

[1 of 1 positions shown; findings below may reference images not displayed]

FINDINGS: Exam demonstrates a left total hip arthroplasty which
appears intact and normally located.  There mild degenerate changes
of the right hip and spine.  There mild degenerate changes of the
sacroiliac joints.
IMPRESSION: Left total left total hip arthroplasty intact and normally located.

Degenerate change of the spine, sacroiliac joints and right hip.

## 2015-01-01 ENCOUNTER — Encounter: Payer: Self-pay | Admitting: Family Medicine

## 2015-01-01 ENCOUNTER — Ambulatory Visit (INDEPENDENT_AMBULATORY_CARE_PROVIDER_SITE_OTHER): Payer: Medicare PPO | Admitting: Family Medicine

## 2015-01-01 VITALS — BP 126/82 | HR 82 | Temp 97.6°F | Ht 62.0 in | Wt 177.5 lb

## 2015-01-01 DIAGNOSIS — R739 Hyperglycemia, unspecified: Secondary | ICD-10-CM

## 2015-01-01 DIAGNOSIS — Z6832 Body mass index (BMI) 32.0-32.9, adult: Secondary | ICD-10-CM

## 2015-01-01 DIAGNOSIS — IMO0001 Reserved for inherently not codable concepts without codable children: Secondary | ICD-10-CM

## 2015-01-01 DIAGNOSIS — E785 Hyperlipidemia, unspecified: Secondary | ICD-10-CM

## 2015-01-01 DIAGNOSIS — Z0000 Encounter for general adult medical examination without abnormal findings: Secondary | ICD-10-CM

## 2015-01-01 DIAGNOSIS — R030 Elevated blood-pressure reading, without diagnosis of hypertension: Secondary | ICD-10-CM | POA: Diagnosis not present

## 2015-01-01 DIAGNOSIS — Z1239 Encounter for other screening for malignant neoplasm of breast: Secondary | ICD-10-CM | POA: Diagnosis not present

## 2015-01-01 LAB — LIPID PANEL
CHOLESTEROL: 247 mg/dL — AB (ref 0–200)
HDL: 104.2 mg/dL (ref >39.00)
LDL CALC: 129 mg/dL — AB (ref 0–99)
NonHDL: 142.8
Total CHOL/HDL Ratio: 2
Triglycerides: 71 mg/dL (ref 0.0–149.0)
VLDL: 14.2 mg/dL (ref 0.0–40.0)

## 2015-01-01 LAB — HEMOGLOBIN A1C: HEMOGLOBIN A1C: 5.4 % (ref 4.6–6.5)

## 2015-01-01 NOTE — Progress Notes (Signed)
Pre visit review using our clinic review tool, if applicable. No additional management support is needed unless otherwise documented below in the visit note. 

## 2015-01-01 NOTE — Progress Notes (Signed)
Medicare Annual Preventive Care Visit  (initial annual wellness or annual wellness exam) I am actually not quite sure why she come to these visits as she usually refuses all labs and preventive care measures.  Concerns and/or follow up today:  New problems  Back pain: -for > 1 year -hx of OA and spinal stenosis -mild pain, intermittent, worse with long care rides when gets out of the care - once moving gets better, not limiting her and she prefers to not do a work up or any invasive tx of this - wants some exercises to do at home -denies: severe pain, weakness, numbness, bowel or bladder dysfunction, malaise, weight loss fevers  Elevated BP: -she denies any hx of elevated BP -reports argument with husband at check in regarding her insurance and anxious -denies: CP, HA, SOB, DOE  Chronic:  Obesity: -currently walking a little -diet is ok, but not great  ROS: negative for report of fevers, unintentional weight loss, vision changes, vision loss, hearing loss or change, chest pain, sob, hemoptysis, melena, hematochezia, hematuria, genital discharge or lesions, falls, bleeding or bruising, loc, thoughts of suicide or self harm, memory loss  1.) Patient-completed health risk assessment  - completed and reviewed, see scanned documentation  2.) Review of Medical History: -PMH, PSH, Family History and current specialty and care providers reviewed and updated and listed below  - see scanned in document in chart and below  Past Medical History  Diagnosis Date  . Arthritis   . Osteoarthritis of left hip     Past Surgical History  Procedure Laterality Date  . Abdominal hysterectomy  1977  . Tonsillectomy and adenoidectomy  1939  . Total hip arthroplasty Left 03/19/2013    Procedure: LEFT TOTAL HIP ARTHROPLASTY ANTERIOR APPROACH;  Surgeon: [name], MD;  Location: [facility];  Service: Orthopedics;  Laterality: Left;    History   Social History  . Marital Status: Married   Spouse Name: N/A  . Number of Children: N/A  . Years of Education: N/A   Occupational History  . Not on file.   Social History Main Topics  . Smoking status: Never Smoker   . Smokeless tobacco: Never Used  . Alcohol Use: 1.2 oz/week    2 Glasses of wine per week     Comment: 1 glass of wine daily   . Drug Use: No  . Sexual Activity: Not on file   Other Topics Concern  . 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 NO  History of falling? NO  Any trouble with IADLs - using a phone, using transportation, grocery shopping, preparing meals, doing housework, doing laundry, taking medications and managing money? NO  Advance Directives? YES   See summary of recommendations in Patient Instructions below.  4.) Physical Exam Filed Vitals:   01/01/15 0815  BP: 126/82  Pulse: 82  Temp: 97.6 F (36.4 C)   Estimated body mass index is 32.46 kg/(m^2) as calculated from the following:   Height as of this encounter:  (1.575 m).   Weight  as of this encounter: 177 lb 8 oz (80.513 kg).  EKG (optional): deferred  General: alert, appear well hydrated and in no acute distress  HEENT: visual acuity grossly intact  CV: HRRR  Lungs: CTA bilaterally  Psych: pleasant and cooperative, no obvious depression or anxiety  NS/NEURO: gait and rising from chair normal, minimal TTP in upper glutes and PSIS bilat, neg facet laoding, neg SLRT and CLRT bilat, neg FABIR and FADIE, normal strength, sensation to light touch and DTRs in bilat LE, pedal pulses normal  Cognitive function grossly intact  See patient instructions for recommendations.  Education and  counseling regarding the above review of health provided with a plan for the following: -see scanned patient completed form for further details -fall prevention strategies discussed  -healthy lifestyle discussed -importance and resources for completing advanced directives discussed -see patient instructions below for any other recommendations provided  4)The following written screening schedule of preventive measures were reviewed with assessment and plan made per below, orders and patient instructions:      AAA screening: n/a     Alcohol screening: done     Obesity Screening and counseling: done     STI screening (Hep C if born 1943-65): declined     Tobacco Screening: done       Pneumococcal (PPSV23 -one dose after 64, one before if risk factors), influenza yearly and hepatitis B vaccines (if high risk - end stage renal disease, IV drugs, homosexual men, live in home for mentally retarded, hemophilia receiving factors) ASSESSMENT/PLAN: done      Screening mammograph (yearly if >40) ASSESSMENT/PLAN: refused      Screening Pap smear/pelvic exam (q2 years) ASSESSMENT/PLAN: declined      Prostate cancer screening ASSESSMENT/PLAN: n/a      Colorectal cancer screening (FOBT yearly or flex sig q4y or colonoscopy q10y or barium enema q4y) ASSESSMENT/PLAN: n/a      Diabetes outpatient self-management training services ASSESSMENT/PLAN: n/a      Bone mass measurements(covered q2y if indicated - estrogen def, osteoporosis, hyperparathyroid, vertebral abnormalities, osteoporosis or steroids) ASSESSMENT/PLAN: refused      Screening for glaucoma(q1y if high risk - diabetes, FH, AA and > 50 or hispanic and > 65) ASSESSMENT/PLAN: sees optho      Medical nutritional therapy for individuals with diabetes or renal disease ASSESSMENT/PLAN: n/a      Cardiovascular screening blood tests (lipids q5y) ASSESSMENT/PLAN: doing today - non-fasting      Diabetes screening tests ASSESSMENT/PLAN:  doing today   7.) Summary: -risk factors and conditions per above assessment were discussed and treatment, recommendations and referrals were offered per documentation above and orders and patient instructions.  Medicare annual wellness visit, subsequent -see above, below and extensive scanned documentation  Breast cancer screening - Plan: MM Digital Screening  BMI 32.0-32.9,adult -lifestyle recs  Hyperglycemia - Plan: Hemoglobin A1c  Hyperlipemia - Plan: Lipid panel  Elevated blood pressure -improved on recheck, monitor  Back Pain: -we discussed possible serious and likely etiologies, workup and treatment, treatment risks and return precautions -after this discussion, Jabree opted for HEP and tylenol and no further eval -follow up advised 3 months or as needed -of course, we advised Lyani  to return or notify a doctor immediately if symptoms worsen or persist or new concerns arise.  .   Patient Instructions  BEFORE YOU LEAVE: -labs -low back exercises -follow up in 3-4 months  Do the back exercises 4 days per week. Please follow up as planned or sooner if worsening,  new symptoms or back pain not improving.  We recommend the following healthy lifestyle measures: - eat a healthy diet consisting of lots of vegetables, fruits, beans, nuts, seeds, healthy meats such as white chicken and fish  - avoid fried foods, starches, sweets, fast food, processed foods, sodas, red meet and other fattening foods.  - get a least 150 minutes of aerobic exercise per week.   The following written screening schedule of preventive measures were reviewed with assessment and plan made per below, orders and patient instructions:      AAA screening: n/a     Alcohol screening: done     Obesity Screening and counseling: done     STI screening (Hep C if born [birth year]-65): declined     Tobacco Screening: done       Pneumococcal (PPSV23 -one dose after 64, one before if risk factors), influenza yearly  and hepatitis B vaccines (if high risk - end stage renal disease, IV drugs, homosexual men, live in home for mentally retarded, hemophilia receiving factors) ASSESSMENT/PLAN: done      Screening mammograph (yearly if >40) ASSESSMENT/PLAN: refused      Screening Pap smear/pelvic exam (q2 years) ASSESSMENT/PLAN: declined      Prostate cancer screening ASSESSMENT/PLAN: n/a      Colorectal cancer screening (FOBT yearly or flex sig q4y or colonoscopy q10y or barium enema q4y) ASSESSMENT/PLAN: n/a      Diabetes outpatient self-management training services ASSESSMENT/PLAN: n/a      Bone mass measurements(covered q2y if indicated - estrogen def, osteoporosis, hyperparathyroid, vertebral abnormalities, osteoporosis or steroids) ASSESSMENT/PLAN: refused      Screening for glaucoma(q1y if high risk - diabetes, FH, AA and > 50 or hispanic and > 65) ASSESSMENT/PLAN: sees optho      Medical nutritional therapy for individuals with diabetes or renal disease ASSESSMENT/PLAN: n/a      Cardiovascular screening blood tests (lipids q5y) ASSESSMENT/PLAN: doing today - non-fasting      Diabetes screening tests ASSESSMENT/PLAN: doing today

## 2015-01-01 NOTE — Patient Instructions (Addendum)
BEFORE YOU LEAVE: -labs -low back exercises -follow up in 3-4 months  We placed a referral for you as discussed for the mammogram. It usually takes about 1-2 weeks to process and schedule this referral. If you have not heard from [place] regarding this appointment in 2 weeks please contact our office.  Do the back exercises 4 days per week. Please follow up as planned or sooner if worsening, new symptoms or back pain not improving.  We recommend the following healthy lifestyle measures: - eat a healthy diet consisting of lots of vegetables, fruits, beans, nuts, seeds, healthy meats such as white chicken and fish  - avoid fried foods, starches, sweets, fast food, processed foods, sodas, red meet and other fattening foods.  - get a least 150 minutes of aerobic exercise per week.   The following written screening schedule of preventive measures were reviewed with assessment and plan made per below, orders and patient instructions:      AAA screening: n/a     Alcohol screening: done     Obesity Screening and counseling: done     STI screening (Hep C if born [birth year]65): declined     Tobacco Screening: done       Pneumococcal (PPSV23 -one dose after 64, one before if risk factors), influenza yearly and hepatitis B vaccines (if high risk - end stage renal disease, IV drugs, homosexual men, live in home for mentally retarded, hemophilia receiving factors) ASSESSMENT/PLAN: done      Screening mammograph (yearly if >40) ASSESSMENT/PLAN: refused      Screening Pap smear/pelvic exam (q2 years) ASSESSMENT/PLAN: declined      Prostate cancer screening ASSESSMENT/PLAN: n/a      Colorectal cancer screening (FOBT yearly or flex sig q4y or colonoscopy q10y or barium enema q4y) ASSESSMENT/PLAN: n/a      Diabetes outpatient self-management training services ASSESSMENT/PLAN: n/a      Bone mass measurements(covered q2y if indicated - estrogen def, osteoporosis, hyperparathyroid, vertebral abnormalities,  osteoporosis or steroids) ASSESSMENT/PLAN: refused      Screening for glaucoma(q1y if high risk - diabetes, FH, AA and > 50 or hispanic and > 65) ASSESSMENT/PLAN: sees optho      Medical nutritional therapy for individuals with diabetes or renal disease ASSESSMENT/PLAN: n/a      Cardiovascular screening blood tests (lipids q5y) ASSESSMENT/PLAN: doing today - non-fasting      Diabetes screening tests ASSESSMENT/PLAN: doing today

## 2015-04-09 ENCOUNTER — Telehealth: Payer: Self-pay | Admitting: Family Medicine

## 2015-04-09 ENCOUNTER — Encounter: Payer: Self-pay | Admitting: Family Medicine

## 2015-04-09 ENCOUNTER — Ambulatory Visit (INDEPENDENT_AMBULATORY_CARE_PROVIDER_SITE_OTHER): Payer: Medicare PPO | Admitting: Family Medicine

## 2015-04-09 VITALS — BP 152/90 | HR 90 | Temp 97.8°F | Ht 62.0 in | Wt 179.3 lb

## 2015-04-09 DIAGNOSIS — I10 Essential (primary) hypertension: Secondary | ICD-10-CM | POA: Diagnosis not present

## 2015-04-09 DIAGNOSIS — E785 Hyperlipidemia, unspecified: Secondary | ICD-10-CM | POA: Diagnosis not present

## 2015-04-09 DIAGNOSIS — M545 Low back pain: Secondary | ICD-10-CM | POA: Diagnosis not present

## 2015-04-09 DIAGNOSIS — E669 Obesity, unspecified: Secondary | ICD-10-CM

## 2015-04-09 LAB — BASIC METABOLIC PANEL
BUN: 21 mg/dL (ref 6–23)
CALCIUM: 9.6 mg/dL (ref 8.4–10.5)
CHLORIDE: 102 meq/L (ref 96–112)
CO2: 32 meq/L (ref 19–32)
Creatinine, Ser: 0.8 mg/dL (ref 0.40–1.20)
GFR: 73.02 mL/min (ref >60.00)
GLUCOSE: 82 mg/dL (ref 70–99)
Potassium: 4 mEq/L (ref 3.5–5.1)
SODIUM: 139 meq/L (ref 135–145)

## 2015-04-09 MED ORDER — HYDROCHLOROTHIAZIDE 12.5 MG PO TABS: 12.5000 mg | ORAL_TABLET | Freq: Every day | ORAL | Status: DC

## 2015-04-09 NOTE — Patient Instructions (Addendum)
BEFORE YOU LEAVE: -labs -schedule follow up in 1 month  Call Dr. Grant Fontana office today for an appointment for re-evaluation and treatment of your back pain. Please let us know if you need a referral.  Start the blood pressure medication HCTZ(hydrochlorthiazide) 12.5 mg daily in the morning and follow up in 1 month  We recommend the following healthy lifestyle measures: - eat a healthy diet consisting small portions of vegetables, fruits, beans, nuts, seeds, healthy meats such as white chicken and fish  - avoid sweets, starches, fried foods, fast food, processed foods, sodas, red meet and other fattening foods.  - get a least 150 minutes of aerobic exercise per week.

## 2015-04-09 NOTE — Telephone Encounter (Signed)
Pt call to say that they check with the pharmacy and the following med has not been called in  HCTZ  hydrochlorthiazide   Pharmacy [facility]

## 2015-04-09 NOTE — Progress Notes (Signed)
Pre visit review using our clinic review tool, if applicable. No additional management support is needed unless otherwise documented below in the visit note. 

## 2015-04-09 NOTE — Progress Notes (Signed)
HPI:  Back pain: -for > 1 year -hx of OA and spinal stenosis, reports sat Dr. Ethelene Hal in the past -mild pain, intermittent, worse with long care rides when gets out of the care - once moving gets better, seems to be worsening -HEP provided last visit -denies: severe pain, weakness, numbness, bowel or bladder dysfunction, malaise, weight loss fevers  Elevated BP/Mild HLD: -at last visit, then resolved -up again today -denies: CP, SOB, DOe  Obesity: -currently walking a little but limited due to back issues -diet is ok, but not great   ROS: See pertinent positives and negatives per HPI.  Past Medical History  Diagnosis Date  . Arthritis   . Osteoarthritis of left hip     Past Surgical History  Procedure Laterality Date  . Abdominal hysterectomy  1977  . Tonsillectomy and adenoidectomy  1939  . Total hip arthroplasty Left 03/19/2013    Procedure: LEFT TOTAL HIP ARTHROPLASTY ANTERIOR APPROACH;  Surgeon: [name], MD;  Location: [facility];  Service: Orthopedics;  Laterality: Left;    No family history on file.  Social History   Social History  . Marital Status: Married    Spouse Name: N/A  . Number of Children: N/A  . Years of Education: N/A   Social History Main Topics  . Smoking status: Never Smoker   . Smokeless tobacco: Never Used  . Alcohol Use: 1.2 oz/week    2 Glasses of wine per week     Comment: 1 glass of wine daily   . Drug Use: No  . Sexual Activity: Not Asked   Other Topics Concern  . 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  aspirin 81 MG tablet, Take 81 mg by mouth daily., Disp: , Rfl:  .  calcium gluconate 500 MG tablet, Take 500 mg by mouth daily., Disp: , Rfl:  .  Ibuprofen 200 MG CAPS, Take by  mouth., Disp: , Rfl:   EXAM:  Filed Vitals:   04/09/15 0900  BP: 152/90  Pulse: 90  Temp: 97.8 F (36.6 C)    Body mass index is 32.79 kg/(m^2).  GENERAL: vitals reviewed and listed above, alert, oriented, appears well hydrated and in no acute distress  HEENT: atraumatic, conjunttiva clear, no obvious abnormalities on inspection of external nose and ears  NECK: no obvious masses on inspection  LUNGS: clear to auscultation bilaterally, no wheezes, rales or rhonchi, good air movement  CV: HRRR, no peripheral edema  MS: moves all extremities without noticeable abnormality  PSYCH: pleasant and cooperative, no obvious depression or anxiety  ASSESSMENT AND PLAN:  Discussed the following assessment and plan:  Essential hypertension - Plan: Basic metabolic panel -discuss risks/benefits treatment options -she decided to try low dose diuretic, check bmp today -lifestyle recs -follow up 1 month -limit NSAIDs  Hyperlipemia -lifestyle recs and monitor  Low back pain, unspecified back pain laterality, with sciatica presence unspecified -discussed, she opted to get back in with Dr. Ethelene Hal and agrees to call for appointment  Obesity -lifestyle recs  -Patient advised to return or notify a doctor immediately if symptoms worsen or persist or new concerns arise.  Patient  Instructions  BEFORE YOU LEAVE: -labs -schedule follow up in 1 month  Call Dr. Grant Fontana office today for an appointment for re-evaluation and treatment of your back pain. Please let us know if you need a referral.  Start the blood pressure medication HCTZ(hydrochlorthiazide) 12.5 mg daily in the morning and follow up in 1 month  We recommend the following healthy lifestyle measures: - eat a healthy diet consisting small portions of vegetables, fruits, beans, nuts, seeds, healthy meats such as white chicken and fish  - avoid sweets, starches, fried foods, fast food, processed foods, sodas, red meet and other  fattening foods.  - get a least 150 minutes of aerobic exercise per week.       Kriste Basque R.

## 2015-05-07 ENCOUNTER — Encounter: Payer: Self-pay | Admitting: Family Medicine

## 2015-05-07 ENCOUNTER — Ambulatory Visit (INDEPENDENT_AMBULATORY_CARE_PROVIDER_SITE_OTHER): Payer: Medicare PPO | Admitting: Family Medicine

## 2015-05-07 VITALS — BP 132/78 | HR 92 | Temp 98.2°F | Ht 62.0 in | Wt 179.6 lb

## 2015-05-07 DIAGNOSIS — I10 Essential (primary) hypertension: Secondary | ICD-10-CM | POA: Insufficient documentation

## 2015-05-07 DIAGNOSIS — E669 Obesity, unspecified: Secondary | ICD-10-CM

## 2015-05-07 HISTORY — DX: Obesity, unspecified: E66.9

## 2015-05-07 HISTORY — DX: Essential (primary) hypertension: I10

## 2015-05-07 NOTE — Progress Notes (Signed)
Pre visit review using our clinic review tool, if applicable. No additional management support is needed unless otherwise documented below in the visit note. 

## 2015-05-07 NOTE — Progress Notes (Signed)
  HPI:  Follow up HTN: -elevated intermittently in the past -started low dose hctz 9.2016  -reports: tolerating medication well - a few headaches when she first started it that resolved, reports check BP daily and has been in the 120-130/70s -denies: CP, SOB, DOE, HA  ROS: See pertinent positives and negatives per HPI.  Past Medical History  Diagnosis Date  . Arthritis   . Osteoarthritis of left hip   . Essential hypertension 05/07/2015  . Obesity (BMI 30-39.9) 05/07/2015    Past Surgical History  Procedure Laterality Date  . Abdominal hysterectomy  1977  . Tonsillectomy and adenoidectomy  1939  . Total hip arthroplasty Left 03/19/2013    Procedure: LEFT TOTAL HIP ARTHROPLASTY ANTERIOR APPROACH;  Surgeon: Shelda PalMatthew D Olin, MD;  Location: [facility];  Service: Orthopedics;  Laterality: Left;    No family history on file.  Social History   Social History  . Marital Status: Married    Spouse Name: N/A  . Number of Children: N/A  . Years of Education: N/A   Social History Main Topics  . Smoking status: Never Smoker   . Smokeless tobacco: Never Used  . Alcohol Use: 1.2 oz/week    2 Glasses of wine per week     Comment: 1 glass of wine daily   . Drug Use: No  . Sexual Activity: Not Asked   Other Topics Concern  . 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  aspirin 81 MG tablet, Take 81 mg by mouth daily., Disp: , Rfl:  .  calcium gluconate 500 MG tablet, Take 500 mg by mouth daily., Disp: , Rfl:  .  hydrochlorothiazide (HYDRODIURIL) 12.5 MG tablet, Take 1 tablet (12.5 mg total) by mouth daily., Disp: 90 tablet, Rfl: 3 .  Ibuprofen 200 MG CAPS, Take by mouth., Disp: , Rfl:   EXAM:  Filed Vitals:   05/07/15 1014  BP: 132/78  Pulse:  92  Temp: 98.2 F (36.8 C)    Body mass index is 32.84 kg/(m^2).  GENERAL: vitals reviewed and listed above, alert, oriented, appears well hydrated and in no acute distress  HEENT: atraumatic, conjunttiva clear, no obvious abnormalities on inspection of external nose and ears  NECK: no obvious masses on inspection  LUNGS: clear to auscultation bilaterally, no wheezes, rales or rhonchi, good air movement  CV: HRRR, no peripheral edema  MS: moves all extremities without noticeable abnormality  PSYCH: pleasant and cooperative, no obvious depression or anxiety  ASSESSMENT AND PLAN:  Discussed the following assessment and plan:  Essential hypertension  Obesity (BMI 30-39.9)  -healthy lifestyle and continue current treatment -follow up in 3-4 months -Patient advised to return or notify a doctor immediately if symptoms worsen or persist or new concerns arise.  There are no Patient Instructions on file for this visit.   Kriste BasqueKIM, Samrat Hayward R.

## 2015-09-08 ENCOUNTER — Encounter: Payer: Self-pay | Admitting: Family Medicine

## 2015-09-08 ENCOUNTER — Ambulatory Visit (INDEPENDENT_AMBULATORY_CARE_PROVIDER_SITE_OTHER): Payer: Medicare Other | Admitting: Family Medicine

## 2015-09-08 VITALS — BP 132/80 | HR 90 | Temp 97.7°F | Ht 62.0 in | Wt 176.8 lb

## 2015-09-08 DIAGNOSIS — D692 Other nonthrombocytopenic purpura: Secondary | ICD-10-CM | POA: Diagnosis not present

## 2015-09-08 DIAGNOSIS — E785 Hyperlipidemia, unspecified: Secondary | ICD-10-CM | POA: Diagnosis not present

## 2015-09-08 DIAGNOSIS — I10 Essential (primary) hypertension: Secondary | ICD-10-CM | POA: Diagnosis not present

## 2015-09-08 DIAGNOSIS — E669 Obesity, unspecified: Secondary | ICD-10-CM | POA: Diagnosis not present

## 2015-09-08 NOTE — Progress Notes (Signed)
HPI:  Elevated BP/Mild HLD: -meds: asa, hctz 12.5  -denies: CP, SOB, DOE  Obesity: -exercise: walks a few minutes daily but no regular aerobic exercise -diet: is ok, but not great -not afraid of falling, no falls, no pain barriers to exercise  Small bruise: -r arm -for a few days -no pain or pruritis  ROS: See pertinent positives and negatives per HPI.  Past Medical History  Diagnosis Date  . Arthritis   . Osteoarthritis of left hip   . Essential hypertension 05/07/2015  . Obesity (BMI 30-39.9) 05/07/2015    Past Surgical History  Procedure Laterality Date  . Abdominal hysterectomy  1977  . Tonsillectomy and adenoidectomy  1939  . Total hip arthroplasty Left 03/19/2013    Procedure: LEFT TOTAL HIP ARTHROPLASTY ANTERIOR APPROACH;  Surgeon: [name], MD;  Location: [facility];  Service: Orthopedics;  Laterality: Left;    No family history on file.  Social History   Social History  . Marital Status: Married    Spouse Name: N/A  . Number of Children: N/A  . Years of Education: N/A   Social History Main Topics  . Smoking status: Never Smoker   . Smokeless tobacco: Never Used  . Alcohol Use: 1.2 oz/week    2 Glasses of wine per week     Comment: 1 glass of wine daily   . Drug Use: No  . Sexual Activity: Not Asked   Other Topics Concern  . 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  aspirin 81 MG tablet, Take 81 mg by mouth daily., Disp: , Rfl:  .  calcium gluconate 500 MG tablet, Take 500 mg by mouth daily., Disp: , Rfl:  .  hydrochlorothiazide (HYDRODIURIL) 12.5 MG tablet, Take 1 tablet (12.5 mg total) by mouth daily., Disp: 90 tablet, Rfl: 3 .  Ibuprofen 200 MG CAPS, Take by mouth., Disp: , Rfl:   EXAM:  Filed  Vitals:   09/08/15 0902  BP: 132/80  Pulse: 90  Temp: 97.7 F (36.5 C)    Body mass index is 32.33 kg/(m^2).  GENERAL: vitals reviewed and listed above, alert, oriented, appears well hydrated and in no acute distress  HEENT: atraumatic, conjunttiva clear, no obvious abnormalities on inspection of external nose and ears  NECK: no obvious masses on inspection  LUNGS: clear to auscultation bilaterally, no wheezes, rales or rhonchi, good air movement  CV: HRRR, no peripheral edema  MS: moves all extremities without noticeable abnormality  Skin: smal purple/dark red patch on R ext forearm  PSYCH: pleasant and cooperative, no obvious depression or anxiety  ASSESSMENT AND PLAN:  Discussed the following assessment and plan:  Essential hypertension -better on recheck, cont current tx -advised to really limit ibuprofen use  Obesity (BMI 30-39.9) -advised healthy diet and increase exercise gradually  Hyperlipemia -labs next visit, lifestyle recs  Senile purpura (HCC) -advised caution with ambulation, fall prevention, follow up sooner if worsening or persists  -Patient advised to return or notify a doctor immediately if symptoms worsen or persist or new concerns arise.  Patient Instructions  BEFORE YOU LEAVE: -schedule MEDICARE ANNUAL WELLNESS EXAM in 4 months - come  fasting for that visit but drink plenty of water and we will plan to do labs  We recommend the following healthy lifestyle measures: - eat a healthy whole foods diet consisting of regular small meals composed of vegetables, fruits, beans, nuts, seeds, healthy meats such as white chicken and fish and whole grains.  - avoid sweets, white starchy foods, fried foods, fast food, processed foods, sodas, red meet and other fattening foods.  - get a least 150-300 minutes of aerobic exercise per week.       Kriste Basque R.

## 2015-09-08 NOTE — Progress Notes (Signed)
Pre visit review using our clinic review tool, if applicable. No additional management support is needed unless otherwise documented below in the visit note. 

## 2015-09-08 NOTE — Patient Instructions (Signed)
BEFORE YOU LEAVE: -schedule MEDICARE ANNUAL WELLNESS EXAM in 4 months - come fasting for that visit but drink plenty of water and we will plan to do labs  We recommend the following healthy lifestyle measures: - eat a healthy whole foods diet consisting of regular small meals composed of vegetables, fruits, beans, nuts, seeds, healthy meats such as white chicken and fish and whole grains.  - avoid sweets, white starchy foods, fried foods, fast food, processed foods, sodas, red meet and other fattening foods.  - get a least 150-300 minutes of aerobic exercise per week.

## 2016-01-07 ENCOUNTER — Telehealth: Payer: Self-pay | Admitting: Family Medicine

## 2016-01-07 ENCOUNTER — Ambulatory Visit (INDEPENDENT_AMBULATORY_CARE_PROVIDER_SITE_OTHER): Payer: Medicare Other | Admitting: Family Medicine

## 2016-01-07 ENCOUNTER — Encounter: Payer: Self-pay | Admitting: Family Medicine

## 2016-01-07 VITALS — BP 132/82 | HR 65 | Temp 98.3°F | Ht 62.0 in | Wt 177.3 lb

## 2016-01-07 DIAGNOSIS — Z0000 Encounter for general adult medical examination without abnormal findings: Secondary | ICD-10-CM | POA: Diagnosis not present

## 2016-01-07 DIAGNOSIS — I10 Essential (primary) hypertension: Secondary | ICD-10-CM | POA: Diagnosis not present

## 2016-01-07 DIAGNOSIS — E669 Obesity, unspecified: Secondary | ICD-10-CM | POA: Diagnosis not present

## 2016-01-07 LAB — CBC WITH DIFFERENTIAL/PLATELET
BASOS PCT: 1 % (ref 0.0–3.0)
Basophils Absolute: 0 10*3/uL (ref 0.0–0.1)
EOS ABS: 0.1 10*3/uL (ref 0.0–0.7)
Eosinophils Relative: 3.8 % (ref 0.0–5.0)
HCT: 41.9 % (ref 36.0–46.0)
HEMOGLOBIN: 14.5 g/dL (ref 12.0–15.0)
Lymphocytes Relative: 26.2 % (ref 12.0–46.0)
Lymphs Abs: 0.9 10*3/uL (ref 0.7–4.0)
MCHC: 34.7 g/dL (ref 30.0–36.0)
MCV: 96.4 fl (ref 78.0–100.0)
MONO ABS: 0.3 10*3/uL (ref 0.1–1.0)
Monocytes Relative: 9.4 % (ref 3.0–12.0)
NEUTROS PCT: 59.6 % (ref 43.0–77.0)
Neutro Abs: 2.1 10*3/uL (ref 1.4–7.7)
PLATELETS: 182 10*3/uL (ref 150.0–400.0)
RBC: 4.35 Mil/uL (ref 3.87–5.11)
RDW: 13.5 % (ref 11.5–15.5)
WBC: 3.5 10*3/uL — AB (ref 4.0–10.5)

## 2016-01-07 LAB — BASIC METABOLIC PANEL
BUN: 18 mg/dL (ref 6–23)
CO2: 33 mEq/L — ABNORMAL HIGH (ref 19–32)
Calcium: 10 mg/dL (ref 8.4–10.5)
Chloride: 97 mEq/L (ref 96–112)
Creatinine, Ser: 0.86 mg/dL (ref 0.40–1.20)
GFR: 67.05 mL/min (ref >60.00)
GLUCOSE: 97 mg/dL (ref 70–99)
POTASSIUM: 4 meq/L (ref 3.5–5.1)
Sodium: 137 mEq/L (ref 135–145)

## 2016-01-07 LAB — LIPID PANEL
Cholesterol: 272 mg/dL — ABNORMAL HIGH (ref 0–200)
HDL: 106.1 mg/dL (ref >39.00)
LDL Cholesterol: 153 mg/dL — ABNORMAL HIGH (ref 0–99)
NonHDL: 165.96
Total CHOL/HDL Ratio: 3
Triglycerides: 65 mg/dL (ref 0.0–149.0)
VLDL: 13 mg/dL (ref 0.0–40.0)

## 2016-01-07 NOTE — Patient Instructions (Addendum)
BEFORE YOU LEAVE: -labs -follow up in 4-6 months  We recommend the following healthy lifestyle measures: - eat a healthy whole foods diet consisting of regular small meals composed of vegetables, fruits, beans, nuts, seeds, healthy meats such as white chicken and fish and whole grains.  - avoid sweets, white starchy foods, fried foods, fast food, processed foods, sodas, red meet and other fattening foods.  - get a least 150-300 minutes of aerobic exercise per week.   Schedule you mammogram  Vit D3 [phone] IU daily  We recommend the following healthy lifestyle measures: - eat a healthy whole foods diet consisting of regular small meals composed of vegetables, fruits, beans, nuts, seeds, healthy meats such as white chicken and fish and whole grains.  - avoid sweets, white starchy foods, fried foods, fast food, processed foods, sodas, red meet and other fattening foods.  - get a least 150-300 minutes of aerobic exercise per week.

## 2016-01-07 NOTE — Progress Notes (Signed)
Medicare Annual Preventive Care Visit  (initial annual wellness or annual wellness exam)  Concerns and/or follow up today:  Elevated BP/Mild HLD: -meds: asa, hctz 12.5  -denies: CP, SOB, DOE  Obesity: -exercise: some swimming and walking and home exercises -diet: ok  ROS: negative for report of fevers, unintentional weight loss, vision changes, vision loss, hearing loss or change, chest pain, sob, hemoptysis, melena, hematochezia, hematuria, genital discharge or lesions, falls, bleeding or bruising, loc, thoughts of suicide or self harm, memory loss  1.) Patient-completed health risk assessment  - completed and reviewed, see scanned documentation  2.) Review of Medical History: -PMH, PSH, Family History and current specialty and care providers reviewed and updated and listed below  - see scanned in document in chart and below  Past Medical History  Diagnosis Date  . Arthritis   . Osteoarthritis of left hip   . Essential hypertension 05/07/2015  . Obesity (BMI 30-39.9) 05/07/2015    Past Surgical History  Procedure Laterality Date  . Abdominal hysterectomy  1977  . Tonsillectomy and adenoidectomy  1939  . Total hip arthroplasty Left 03/19/2013    Procedure: LEFT TOTAL HIP ARTHROPLASTY ANTERIOR APPROACH;  Surgeon: [name], MD;  Location: [facility];  Service: Orthopedics;  Laterality: Left;    Social History   Social History  . Marital Status: Married    Spouse Name: N/A  . Number of Children: N/A  . Years of Education: N/A   Occupational History  . Not on file.   Social History Main Topics  . Smoking status: Never Smoker   . Smokeless tobacco: Never Used  . Alcohol Use: 1.2 oz/week    2 Glasses of wine per week     Comment: 1 glass of wine daily   . Drug Use: No  . Sexual Activity: Not on file   Other Topics Concern  . 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  Current Outpatient Prescriptions on File Prior to Visit  Medication Sig Dispense Refill  . aspirin 81 MG tablet Take 81 mg by mouth daily.    . calcium gluconate 500 MG tablet Take 500 mg by mouth daily.    . hydrochlorothiazide (HYDRODIURIL) 12.5 MG tablet Take 1 tablet (12.5 mg total) by mouth daily. 90 tablet 3  . Ibuprofen 200 MG CAPS Take by mouth.     No current facility-administered medications on file prior to visit.     3.) Review of functional ability and level of safety:  Any difficulty hearing?  NO  History of falling?  NO  Any trouble with IADLs - using a phone, using transportation, grocery shopping, preparing meals, doing housework, doing laundry, taking medications and managing money? no  Advance Directives? YES - she updated recently, she agrees to bring copy  See summary of recommendations in Patient Instructions below.  4.) Physical Exam Filed Vitals:   01/07/16 0826  BP: 132/82  Pulse: 65  Temp: 98.3 F (36.8 C)   Estimated body mass index is 32.42 kg/(m^2) as calculated from the following:   Height as of this encounter:  (1.575 m).   Weight as of this  encounter: 177 lb 4.8 oz (80.423 kg).  EKG (optional): deferred  General: alert, appear well hydrated and in no acute distress  HEENT: visual acuity grossly intact  CV: HRRR  Lungs: CTA bilaterally  Psych: pleasant and cooperative, no obvious depression or anxiety  Cognitive function grossly intact  See patient instructions for recommendations.  Education and counseling regarding the above review of health provided with a plan for the following: -see scanned patient completed form for further details -fall prevention strategies discussed  -healthy lifestyle discussed -importance and resources for completing advanced directives  discussed -see patient instructions below for any other recommendations provided  4)The following written screening schedule of preventive measures were reviewed with assessment and plan made per below, orders and patient instructions:      AAA screening done if applicable     Alcohol screening done     Obesity Screening and counseling done     STI screening (Hep C if born 1943-65) offered and per pt wishes     Tobacco Screening done       Pneumococcal (PPSV23 -one dose after 64, one before if risk factors), influenza yearly and hepatitis B vaccines (if high risk - end stage renal disease, IV drugs, homosexual men, live in home for mentally retarded, hemophilia receiving factors) ASSESSMENT/PLAN: refuses all vaccines adamantly      Screening mammograph (yearly if >40) ASSESSMENT/PLAN: she initially refused screening; now will consider      Screening Pap smear/pelvic exam (q2 years) ASSESSMENT/PLAN: n/a, declined      Colorectal cancer screening (FOBT yearly or flex sig q4y or colonoscopy q10y or barium enema q4y) ASSESSMENT/PLAN: n/a      Diabetes outpatient self-management training services ASSESSMENT/PLAN: utd or done      Bone mass measurements(covered q2y if indicated - estrogen def, osteoporosis, hyperparathyroid, vertebral abnormalities, osteoporosis or steroids) ASSESSMENT/PLAN: discussed, she refused because she would not take medications if did this      Screening for glaucoma(q1y if high risk - diabetes, FH, AA and > 50 or hispanic and > 65) ASSESSMENT/PLAN: sees eye doctor for check ups      Medical nutritional therapy for individuals with diabetes or renal disease ASSESSMENT/PLAN: see orders      Cardiovascular screening blood tests (lipids q5y) ASSESSMENT/PLAN: see orders and labs      Diabetes screening tests ASSESSMENT/PLAN: see orders and labs   7.) Summary: -risk factors and conditions per above assessment were discussed and treatment, recommendations and  referrals were offered per documentation above and orders and patient instructions.  Medicare annual wellness visit, subsequent  Essential hypertension - Plan: Basic metabolic panel, CBC with Differential/Platelets  Obesity (BMI 30-39.9) - Plan: Lipid Panel  Patient Instructions  BEFORE YOU LEAVE: -labs -follow up in 4-6 months  We recommend the following healthy lifestyle measures: - eat a healthy whole foods diet consisting of regular small meals composed of vegetables, fruits, beans, nuts, seeds, healthy meats such as white chicken and fish and whole grains.  - avoid sweets, white starchy foods, fried foods, fast food, processed foods, sodas, red meet and other fattening foods.  - get a least 150-300 minutes of aerobic exercise per week.   Schedule you mammogram  Vit D3 [phone] IU daily  We recommend the following healthy lifestyle measures: - eat a healthy whole foods diet consisting of regular small meals composed of vegetables, fruits, beans, nuts, seeds, healthy meats such as white chicken and fish and whole grains.  - avoid sweets, white starchy foods,  fried foods, fast food, processed foods, sodas, red meet and other fattening foods.  - get a least 150-300 minutes of aerobic exercise per week.

## 2016-01-07 NOTE — Progress Notes (Signed)
Pre visit review using our clinic review tool, if applicable. No additional management support is needed unless otherwise documented below in the visit note. 

## 2016-01-07 NOTE — Telephone Encounter (Signed)
Pt was seen today and received a call from our office. Per pt husband no message was left

## 2016-03-28 ENCOUNTER — Other Ambulatory Visit: Payer: Self-pay | Admitting: Family Medicine

## 2016-07-11 NOTE — Progress Notes (Signed)
HPI:  AWV 12/2015  Lori ShanksJoan L Ramirez is a pleasant 82 yo with a PMH significant for HTN, Mild hyperlipidemia and obesity here for follow up. She reports she has been feeling great. She has no new complaints today. She does admit that she has not been very good about exercise or a healthy diet. She recently was on a cruise and gained weight with poor eating habits. She agrees to try to do better with this. Denies chest pain, shortness of breath, fatigue, palpitations or headaches. Due for labs and flu shot. She declined flu shot.  ROS: See pertinent positives and negatives per HPI.  Past Medical History:  Diagnosis Date  . Arthritis   . Essential hypertension 05/07/2015  . Obesity (BMI 30-39.9) 05/07/2015  . Osteoarthritis of left hip     Past Surgical History:  Procedure Laterality Date  . ABDOMINAL HYSTERECTOMY  1977  . TONSILLECTOMY AND ADENOIDECTOMY  1939  . TOTAL HIP ARTHROPLASTY Left 03/19/2013   Procedure: LEFT TOTAL HIP ARTHROPLASTY ANTERIOR APPROACH;  Surgeon: Shelda PalMatthew D Olin, MD;  Location: [facility];  Service: Orthopedics;  Laterality: Left;    No family history on file.  Social History   Social History  . Marital status: Married    Spouse name: N/A  . Number of children: N/A  . Years of education: N/A   Social History Main Topics  . Smoking status: Never Smoker  . Smokeless tobacco: Never Used  . Alcohol use 1.2 oz/week    2 Glasses of wine per week     Comment: 1 glass of wine daily   . Drug use: No  . Sexual activity: Not Asked   Other Topics Concern  . 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  aspirin 81 MG tablet, Take 81 mg by mouth daily., Disp: , Rfl:  .  calcium gluconate 500 MG tablet, Take 500 mg by mouth  daily., Disp: , Rfl:  .  hydrochlorothiazide (HYDRODIURIL) 12.5 MG tablet, TAKE 1 TABLET (12.5 MG TOTAL) BY MOUTH DAILY., Disp: 90 tablet, Rfl: 3 .  Ibuprofen 200 MG CAPS, Take by mouth., Disp: , Rfl:   EXAM:  Vitals:   07/12/16 0838  BP: 132/82  Pulse: 90  Temp: 97.7 F (36.5 C)    Body mass index is 32.98 kg/m.  GENERAL: vitals reviewed and listed above, alert, oriented, appears well hydrated and in no acute distress  HEENT: atraumatic, conjunttiva clear, no obvious abnormalities on inspection of external nose and ears  NECK: no obvious masses on inspection  LUNGS: clear to auscultation bilaterally, no wheezes, rales or rhonchi, good air movement  CV: HRRR, no peripheral edema  MS: moves all extremities without noticeable abnormality  PSYCH: pleasant and cooperative, no obvious depression or anxiety  ASSESSMENT AND PLAN:  Discussed the following assessment and plan:  Essential hypertension - Plan: Basic metabolic panel, CBC (no diff)  Hyperlipidemia, unspecified hyperlipidemia type  Obesity (BMI 30-39.9)  -Labs today -Lifestyle recommendations -Follow up in 3-4 months -Patient advised to return or notify a doctor immediately if symptoms worsen or persist or new concerns arise.  There are no Patient Instructions on file for this  visit.  Colin Benton R., DO

## 2016-07-12 ENCOUNTER — Encounter: Payer: Self-pay | Admitting: Family Medicine

## 2016-07-12 ENCOUNTER — Ambulatory Visit (INDEPENDENT_AMBULATORY_CARE_PROVIDER_SITE_OTHER): Payer: Medicare Other | Admitting: Family Medicine

## 2016-07-12 VITALS — BP 132/82 | HR 90 | Temp 97.7°F | Ht 62.0 in | Wt 180.3 lb

## 2016-07-12 DIAGNOSIS — I10 Essential (primary) hypertension: Secondary | ICD-10-CM

## 2016-07-12 DIAGNOSIS — E669 Obesity, unspecified: Secondary | ICD-10-CM | POA: Diagnosis not present

## 2016-07-12 DIAGNOSIS — E785 Hyperlipidemia, unspecified: Secondary | ICD-10-CM

## 2016-07-12 LAB — CBC
HCT: 42.5 % (ref 36.0–46.0)
Hemoglobin: 14.8 g/dL (ref 12.0–15.0)
MCHC: 34.8 g/dL (ref 30.0–36.0)
MCV: 95.6 fl (ref 78.0–100.0)
PLATELETS: 187 10*3/uL (ref 150.0–400.0)
RBC: 4.44 Mil/uL (ref 3.87–5.11)
RDW: 14.2 % (ref 11.5–15.5)
WBC: 3.7 10*3/uL — ABNORMAL LOW (ref 4.0–10.5)

## 2016-07-12 LAB — BASIC METABOLIC PANEL
BUN: 22 mg/dL (ref 6–23)
CALCIUM: 9.7 mg/dL (ref 8.4–10.5)
CO2: 31 meq/L (ref 19–32)
Chloride: 98 mEq/L (ref 96–112)
Creatinine, Ser: 0.89 mg/dL (ref 0.40–1.20)
GFR: 64.37 mL/min (ref >60.00)
Glucose, Bld: 102 mg/dL — ABNORMAL HIGH (ref 70–99)
Potassium: 4 mEq/L (ref 3.5–5.1)
SODIUM: 137 meq/L (ref 135–145)

## 2016-07-12 NOTE — Progress Notes (Signed)
Pre visit review using our clinic review tool, if applicable. No additional management support is needed unless otherwise documented below in the visit note. 

## 2016-10-10 NOTE — Progress Notes (Signed)
HPI:  Follow up: Due for Medicare exam in June - will plan to do labs then.   Lori Ramirez is a pleasant 83 y.o. here for follow up. Chronic medical problems summarized below were reviewed for changes and stability and were updated as needed below. These issues and their treatment remain stable for the most part. Reports 1 month ago go up from sofa and fell going around a turn. Husband called EMS and they evaluated her and she did not go to the hospital. Reports she was not weak before or after and had no LOS. Thinks tripped. No sequela or pain. Denies CP, SOB, DOE, treatment intolerance or new symptoms.  HTN: -meds: asa, hctz  Hyperlipidemia/Obesity: -wt last visit 180 --> today wt is 180  ROS: See pertinent positives and negatives per HPI.  Past Medical History:  Diagnosis Date  . Arthritis   . Essential hypertension 05/07/2015  . Obesity (BMI 30-39.9) 05/07/2015  . Osteoarthritis of left hip     Past Surgical History:  Procedure Laterality Date  . ABDOMINAL HYSTERECTOMY  1977  . TONSILLECTOMY AND ADENOIDECTOMY  1939  . TOTAL HIP ARTHROPLASTY Left 03/19/2013   Procedure: LEFT TOTAL HIP ARTHROPLASTY ANTERIOR APPROACH;  Surgeon: [name], MD;  Location: [facility];  Service: Orthopedics;  Laterality: Left;    No family history on file.  Social History   Social History  . Marital status: Married    Spouse name: N/A  . Number of children: N/A  . Years of education: N/A   Social History Main Topics  . Smoking status: Never Smoker  . Smokeless tobacco: Never Used  . Alcohol use 1.2 oz/week    2 Glasses of wine per week     Comment: 1 glass of wine daily   . Drug use: No  . Sexual activity: Not Asked   Other Topics Concern  . 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  aspirin 81 MG tablet, Take 81 mg by mouth daily., Disp: , Rfl:  .  calcium gluconate 500 MG tablet, Take 500 mg by mouth daily., Disp: , Rfl:  .  hydrochlorothiazide (HYDRODIURIL) 12.5 MG tablet, TAKE 1 TABLET (12.5 MG TOTAL) BY MOUTH DAILY., Disp: 90 tablet, Rfl: 3 .  Ibuprofen 200 MG CAPS, Take by mouth., Disp: , Rfl:   EXAM:  Vitals:   10/11/16 0843  BP: 126/84  Pulse: 85  Temp: 98.1 F (36.7 C)    Body mass index is 32.28 kg/m.  GENERAL: vitals reviewed and listed above, alert, oriented, appears well hydrated and in no acute distress  HEENT: atraumatic, conjunttiva clear, no obvious abnormalities on inspection of external nose and ears  NECK: no obvious masses on inspection  LUNGS: clear to auscultation bilaterally, no wheezes, rales or rhonchi, good air movement  CV: HRRR, no peripheral edema  MS: moves all extremities without noticeable abnormality  PSYCH: pleasant and cooperative, no obvious depression or anxiety  ASSESSMENT AND PLAN:  Discussed the following assessment and plan:  Hyperlipidemia, unspecified hyperlipidemia type  Essential hypertension  Obesity (BMI 30-39.9)  -lifestyle recs -fall precautions -labs at annual exam in a few months -Patient advised  to return or notify a doctor immediately if symptoms worsen or persist or new concerns arise.  Patient Instructions  BEFORE YOU LEAVE: -follow up: Medicare Exam with Darl PikesSusan in June and appt with Dr. Selena BattenKim the same day - will plan to do labs that day.   We recommend the following healthy lifestyle for LIFE: 1) Small portions.   Tip: eat off of a salad plate instead of a dinner plate.  Tip: It is ok to feel hungry after a meal   Tip: if you need more or a snack choose fruits, veggies and/or a handful of nuts or seeds.  2) Eat a healthy clean diet.  * Tip: Avoid (less then 1 serving per week): processed foods, sweets, sweetened drinks, white starches  (rice, flour, bread, potatoes, pasta, etc), red meat, fast foods, butter  *Tip: CHOOSE instead   * 5-9 servings per day of fresh or frozen fruits and vegetables (but not corn, potatoes, bananas, canned or dried fruit)   *nuts and seeds, beans   *olives and olive oil   *small portions of lean meats such as fish and white chicken    *small portions of whole grains  3)Get at least 150 minutes of sweaty aerobic exercise per week.  4)Reduce stress - consider counseling, meditation and relaxation to balance other aspects of your life.     Kriste BasqueKIM, Lamekia Nolden R., DO

## 2016-10-11 ENCOUNTER — Encounter: Payer: Self-pay | Admitting: Family Medicine

## 2016-10-11 ENCOUNTER — Ambulatory Visit (INDEPENDENT_AMBULATORY_CARE_PROVIDER_SITE_OTHER): Payer: Medicare Other | Admitting: Family Medicine

## 2016-10-11 VITALS — BP 126/84 | HR 85 | Temp 98.1°F | Ht 62.0 in | Wt 176.5 lb

## 2016-10-11 DIAGNOSIS — E669 Obesity, unspecified: Secondary | ICD-10-CM | POA: Diagnosis not present

## 2016-10-11 DIAGNOSIS — E785 Hyperlipidemia, unspecified: Secondary | ICD-10-CM | POA: Diagnosis not present

## 2016-10-11 DIAGNOSIS — I10 Essential (primary) hypertension: Secondary | ICD-10-CM | POA: Diagnosis not present

## 2016-10-11 NOTE — Patient Instructions (Signed)
BEFORE YOU LEAVE: -follow up: Medicare Exam with Darl PikesSusan in June and appt with Dr. Selena BattenKim the same day - will plan to do labs that day.   We recommend the following healthy lifestyle for LIFE: 1) Small portions.   Tip: eat off of a salad plate instead of a dinner plate.  Tip: It is ok to feel hungry after a meal   Tip: if you need more or a snack choose fruits, veggies and/or a handful of nuts or seeds.  2) Eat a healthy clean diet.  * Tip: Avoid (less then 1 serving per week): processed foods, sweets, sweetened drinks, white starches (rice, flour, bread, potatoes, pasta, etc), red meat, fast foods, butter  *Tip: CHOOSE instead   * 5-9 servings per day of fresh or frozen fruits and vegetables (but not corn, potatoes, bananas, canned or dried fruit)   *nuts and seeds, beans   *olives and olive oil   *small portions of lean meats such as fish and white chicken    *small portions of whole grains  3)Get at least 150 minutes of sweaty aerobic exercise per week.  4)Reduce stress - consider counseling, meditation and relaxation to balance other aspects of your life.

## 2016-10-11 NOTE — Progress Notes (Signed)
Pre visit review using our clinic review tool, if applicable. No additional management support is needed unless otherwise documented below in the visit note. 

## 2017-01-16 NOTE — Progress Notes (Signed)
Subjective:   Lori Ramirez is a 83 y.o. female who presents for Medicare Annual (Subsequent) preventive examination.  Pt states that she feels good about her health currently.   Review of Systems:  No ROS.  Medicare Wellness Visit. Additional risk factors are reflected in the social history.  Cardiac Risk Factors include: advanced age (>15men, >71 women);dyslipidemia;hypertension;obesity (BMI >30kg/m2);sedentary lifestyle   Sleep patterns: 9 hrs/night. States she wakes up several times a night due to her husband getting up to use restroom. States she wakes up feeling rested.   Home Safety/Smoke Alarms: Feels safe in home. Smoke alarms in place. Pt states she is going to look into purchasing a shower chair.  Living environment; residence and Firearm Safety: Pt states she quit driving one year ago. Lives with husband. Daughter lives in [place]. Lives in 3 story home. Pt sometimes finds it difficult to go upstairs due to arthritis pain. Seat Belt Safety/Bike Helmet: Wears seat belt.   Counseling:   Dental- Pt states she goes every 6 months.   Female:   Pap- Aged out.      Mammo-   Declines. Dexa scan-       Declines. CCS- Aged out.     Objective:     Vitals: BP 138/82 (BP Location: Right Arm, Patient Position: Sitting, Cuff Size: Normal)   Pulse 85   Temp 98.3 F (36.8 C) (Oral)   Resp 14   Ht 5\' 2"  (1.575 m)   Wt 175 lb 8 oz (79.6 kg)   SpO2 95%   BMI 32.10 kg/m   Body mass index is 32.1 kg/m.   Tobacco History  Smoking Status  . Never Smoker  Smokeless Tobacco  . Never Used     Counseling given: Not Answered   Past Medical History:  Diagnosis Date  . Arthritis   . Essential hypertension 05/07/2015  . Obesity (BMI 30-39.9) 05/07/2015  . Osteoarthritis of left hip    Past Surgical History:  Procedure Laterality Date  . ABDOMINAL HYSTERECTOMY  1977  . TONSILLECTOMY AND ADENOIDECTOMY  1939  . TOTAL HIP ARTHROPLASTY Left 03/19/2013   Procedure: LEFT  TOTAL HIP ARTHROPLASTY ANTERIOR APPROACH;  Surgeon: [name], MD;  Location: [facility];  Service: Orthopedics;  Laterality: Left;   History reviewed. No pertinent family history. History  Sexual Activity  . Sexual activity: Not on file    Outpatient Encounter Prescriptions as of 01/17/2017  Medication Sig  . aspirin 81 MG tablet Take 81 mg by mouth daily.  . calcium gluconate 500 MG tablet Take 500 mg by mouth daily.  . hydrochlorothiazide (HYDRODIURIL) 12.5 MG tablet TAKE 1 TABLET (12.5 MG TOTAL) BY MOUTH DAILY.  Marland Kitchen Ibuprofen 200 MG CAPS Take 400 mg by mouth as needed.    No facility-administered encounter medications on file as of 01/17/2017.     Activities of Daily Living In your present state of health, do you have any difficulty performing the following activities: 01/17/2017  Hearing? N  Vision? N  Difficulty concentrating or making decisions? N  Walking or climbing stairs? Y  Dressing or bathing? Y  Doing errands, shopping? Y  Preparing Food and eating ? N  Using the Toilet? N  In the past six months, have you accidently leaked urine? N  Do you have problems with loss of bowel control? N  Managing your Medications? N  Managing your Finances? N  Housekeeping or managing your Housekeeping? N  Some recent data might be hidden  Patient Care Team: Terressa KoyanagiKim, Hannah R, DO as PCP - General (Family Medicine) Jene EveryBeane, Jeffrey, MD as Attending Physician (Orthopedic Surgery)    Assessment:    Physical assessment deferred to PCP.  Exercise Activities and Dietary recommendations Current Exercise Habits: The patient does not participate in regular exercise at present, Exercise limited by: None identified (Pt states that it has been too hot to walk )   Diet (meal preparation, eat out, water intake, caffeinated beverages, dairy products, fruits and vegetables): 3 meals/day.1 cup coffee on Saturday and Sunday. Almost all meals are home cooked.  Breakfast: 1 cup English Breakfast tea.  Shredded wheat, 1 glass OJ, 1/2 banana and 1 piece of toast with apple butter.  Lunch: 1 piece bread with ham and cheese. Eats 1/4 ice cream sandwich. Dinner: Stuffed peppers or spaghetti and always eats vegetables with dinner.        Goals    . Maintain current health status      Fall Risk Fall Risk  01/17/2017 01/07/2016 01/01/2015 01/01/2015 12/27/2013  Falls in the past year? Yes No No No No  Number falls in past yr: 1 - - - -  Injury with Fall? No - - - -  Risk for fall due to : Impaired mobility - - - -  Follow up Education provided;Falls prevention discussed - - - -   Depression Screen PHQ 2/9 Scores 01/17/2017 01/07/2016 01/01/2015 01/01/2015  PHQ - 2 Score 0 0 0 0     Cognitive Function MMSE - Mini Mental State Exam 01/17/2017  Orientation to time 5  Orientation to Place 5  Registration 3  Attention/ Calculation 5  Recall 1  Language- name 2 objects 2  Language- repeat 1  Language- follow 3 step command 3  Language- read & follow direction 1  Write a sentence 1  Copy design 1  Total score 28        Immunization History  Administered Date(s) Administered  . Pneumococcal Polysaccharide-23 06/04/2008   Screening Tests Health Maintenance  Topic Date Due  . TETANUS/TDAP  12/28/2018 (Originally 06/24/1950)  . DEXA SCAN  12/31/2024 (Originally 06/24/1998)  . PNA vac Low Risk Adult (2 of 2 - PCV13) 12/31/2024 (Originally 06/04/2009)  . INFLUENZA VACCINE  07/12/2026 (Originally 02/22/2017)      Plan:    Bring a copy of your advance directives to your next office visit.  Pt declined mammogram, bone density and PCV13.    I have personally reviewed and noted the following in the patient's chart:   . Medical and social history . Use of alcohol, tobacco or illicit drugs  . Current medications and supplements . Functional ability and status . Nutritional status . Physical activity . Advanced directives . List of other physicians . Vitals . Screenings to include cognitive,  depression, and falls . Referrals and appointments  In addition, I have reviewed and discussed with patient certain preventive protocols, quality metrics, and best practice recommendations. A written personalized care plan for preventive services as well as general preventive health recommendations were provided to patient.     Richelle Itoassandra Albright, RN  01/17/2017

## 2017-01-16 NOTE — Progress Notes (Signed)
Pre visit review using our clinic review tool, if applicable. No additional management support is needed unless otherwise documented below in the visit note. 

## 2017-01-16 NOTE — Progress Notes (Signed)
HPI:   Lori Ramirez is a pleasant 83 y.o. here for follow up. Chronic medical problems summarized below were reviewed for changes and stability. She saw our health coach today for her annual wellness visit. Doing well. No complaints. Trying to eat healthy - lots of veggies. Due for labs and fasting today. Denies CP, SOB, DOE, treatment intolerance or new symptoms.  HTN: -meds: asa, hctz  Hyperlipidemia/Obesity: -wt last visit 180 --> 175 today  ROS: See pertinent positives and negatives per HPI.  Past Medical History:  Diagnosis Date  . Arthritis   . Essential hypertension 05/07/2015  . Obesity (BMI 30-39.9) 05/07/2015  . Osteoarthritis of left hip     Past Surgical History:  Procedure Laterality Date  . ABDOMINAL HYSTERECTOMY  1977  . TONSILLECTOMY AND ADENOIDECTOMY  1939  . TOTAL HIP ARTHROPLASTY Left 03/19/2013   Procedure: LEFT TOTAL HIP ARTHROPLASTY ANTERIOR APPROACH;  Surgeon: Shelda PalMatthew D Olin, MD;  Location: [facility];  Service: Orthopedics;  Laterality: Left;    History reviewed. No pertinent family history.  Social History   Social History  . Marital status: Married    Spouse name: N/A  . Number of children: N/A  . Years of education: N/A   Social History Main Topics  . Smoking status: Never Smoker  . Smokeless tobacco: Never Used  . Alcohol use 8.4 oz/week    14 Glasses of wine per week     Comment: 1-2 glasses of wine daily   . Drug use: No  . Sexual activity: Not Asked   Other Topics Concern  . 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  aspirin 81 MG tablet, Take 81 mg by mouth daily., Disp: , Rfl:  .  calcium gluconate 500 MG tablet, Take 500 mg by mouth daily., Disp: , Rfl:  .  hydrochlorothiazide  (HYDRODIURIL) 12.5 MG tablet, TAKE 1 TABLET (12.5 MG TOTAL) BY MOUTH DAILY., Disp: 90 tablet, Rfl: 3 .  Ibuprofen 200 MG CAPS, Take 400 mg by mouth as needed. , Disp: , Rfl:   EXAM:  Vitals:   01/17/17 0759  BP: 138/82  Pulse: 85  Resp: 14  Temp: 98.3 F (36.8 C)    Body mass index is 32.1 kg/m.  GENERAL: vitals reviewed and listed above, alert, oriented, appears well hydrated and in no acute distress  HEENT: atraumatic, conjunttiva clear, no obvious abnormalities on inspection of external nose and ears  NECK: no obvious masses on inspection  LUNGS: clear to auscultation bilaterally, no wheezes, rales or rhonchi, good air movement  CV: HRRR, no peripheral edema  MS: moves all extremities without noticeable abnormality  PSYCH: pleasant and cooperative, no obvious depression or anxiety  ASSESSMENT AND PLAN:  Discussed the following assessment and plan:  Encounter for Medicare annual wellness exam  Essential hypertension - Plan: Basic metabolic panel, CBC  Obesity (BMI 30-39.9)  Hyperlipidemia, unspecified hyperlipidemia type - Plan: Lipid panel  Senile purpura (HCC), Chronic  -labs, fasting -reviewed AWV notes -lifestyle recs -continue current meds -Patient advised to return or notify a doctor immediately if symptoms worsen or persist or new concerns arise.  Patient Instructions  Bring a  copy of your advance directives to your next office visit.    Kriste Basque R., DO

## 2017-01-17 ENCOUNTER — Ambulatory Visit (INDEPENDENT_AMBULATORY_CARE_PROVIDER_SITE_OTHER): Payer: Medicare Other | Admitting: Family Medicine

## 2017-01-17 ENCOUNTER — Encounter: Payer: Self-pay | Admitting: Family Medicine

## 2017-01-17 VITALS — BP 138/82 | HR 85 | Temp 98.3°F | Resp 14 | Ht 62.0 in | Wt 175.5 lb

## 2017-01-17 DIAGNOSIS — I10 Essential (primary) hypertension: Secondary | ICD-10-CM | POA: Diagnosis not present

## 2017-01-17 DIAGNOSIS — E785 Hyperlipidemia, unspecified: Secondary | ICD-10-CM | POA: Diagnosis not present

## 2017-01-17 DIAGNOSIS — E669 Obesity, unspecified: Secondary | ICD-10-CM | POA: Diagnosis not present

## 2017-01-17 DIAGNOSIS — D692 Other nonthrombocytopenic purpura: Secondary | ICD-10-CM

## 2017-01-17 DIAGNOSIS — Z0000 Encounter for general adult medical examination without abnormal findings: Secondary | ICD-10-CM | POA: Diagnosis not present

## 2017-01-17 LAB — LIPID PANEL
CHOL/HDL RATIO: 2
Cholesterol: 289 mg/dL — ABNORMAL HIGH (ref 0–200)
HDL: 122.3 mg/dL (ref >39.00)
LDL Cholesterol: 156 mg/dL — ABNORMAL HIGH (ref 0–99)
NonHDL: 166.34
TRIGLYCERIDES: 53 mg/dL (ref 0.0–149.0)
VLDL: 10.6 mg/dL (ref 0.0–40.0)

## 2017-01-17 LAB — BASIC METABOLIC PANEL
BUN: 19 mg/dL (ref 6–23)
CALCIUM: 10.3 mg/dL (ref 8.4–10.5)
CO2: 32 mEq/L (ref 19–32)
CREATININE: 0.86 mg/dL (ref 0.40–1.20)
Chloride: 97 mEq/L (ref 96–112)
GFR: 66.89 mL/min (ref >60.00)
Glucose, Bld: 101 mg/dL — ABNORMAL HIGH (ref 70–99)
POTASSIUM: 4.2 meq/L (ref 3.5–5.1)
Sodium: 137 mEq/L (ref 135–145)

## 2017-01-17 LAB — CBC
HCT: 44.3 % (ref 36.0–46.0)
Hemoglobin: 15.2 g/dL — ABNORMAL HIGH (ref 12.0–15.0)
MCHC: 34.3 g/dL (ref 30.0–36.0)
MCV: 96.3 fl (ref 78.0–100.0)
PLATELETS: 167 10*3/uL (ref 150.0–400.0)
RBC: 4.6 Mil/uL (ref 3.87–5.11)
RDW: 14.3 % (ref 11.5–15.5)
WBC: 3.8 10*3/uL — AB (ref 4.0–10.5)

## 2017-01-17 NOTE — Progress Notes (Signed)
KIM, HANNAH R., DO  

## 2017-01-17 NOTE — Patient Instructions (Addendum)
BEFORE YOU LEAVE: -follow up: 3-4 months -labs  Bring a copy of your advance directives to your next office visit.

## 2017-01-18 NOTE — Progress Notes (Signed)
Reviewed and agree with above. Jos Cygan R., DO  

## 2017-03-09 ENCOUNTER — Other Ambulatory Visit: Payer: Self-pay | Admitting: Family Medicine

## 2017-04-13 ENCOUNTER — Encounter: Payer: Self-pay | Admitting: Family Medicine

## 2017-05-09 ENCOUNTER — Ambulatory Visit (INDEPENDENT_AMBULATORY_CARE_PROVIDER_SITE_OTHER): Payer: Medicare Other | Admitting: Family Medicine

## 2017-05-09 ENCOUNTER — Encounter: Payer: Self-pay | Admitting: Family Medicine

## 2017-05-09 VITALS — BP 136/78 | HR 86 | Temp 98.0°F | Ht 62.0 in | Wt 176.7 lb

## 2017-05-09 DIAGNOSIS — G8929 Other chronic pain: Secondary | ICD-10-CM | POA: Diagnosis not present

## 2017-05-09 DIAGNOSIS — M545 Low back pain, unspecified: Secondary | ICD-10-CM | POA: Insufficient documentation

## 2017-05-09 DIAGNOSIS — E669 Obesity, unspecified: Secondary | ICD-10-CM

## 2017-05-09 DIAGNOSIS — E785 Hyperlipidemia, unspecified: Secondary | ICD-10-CM

## 2017-05-09 DIAGNOSIS — I10 Essential (primary) hypertension: Secondary | ICD-10-CM

## 2017-05-09 NOTE — Patient Instructions (Signed)
BEFORE YOU LEAVE: -follow up: 3- 4 months -low back exercises  Do the low back exercises 4 days per week.  Advise regular aerobic exercise (at least 150 minutes per week of sweaty exercise) and a healthy diet. Try to eat at least 5-9 servings of vegetables and fruits per day (not corn, potatoes or bananas.) Avoid sweets, red meat, pork, butter, fried foods, fast food, processed food, excessive dairy, eggs and coconut. Replace bad fats with good fats - fish, nuts and seeds, canola oil, olive oil.

## 2017-05-09 NOTE — Progress Notes (Signed)
HPI:   Lori Ramirez is a pleasant 83 y.o. here for follow up. Chronic medical problems summarized below were reviewed for changes and stability and were updated as needed below. These issues and their treatment remain stable for the most part. She reports she does try to stay active and has been eating okay. She has some chronic low back pain. This is bilaterally in the muscles in the lower back in the upper glutes. She doesn't really want to pursue any workup or treatment for this other than she would like some exercises to do at home. No radiation, weakness, numbness or bowel or bladder function changes. Denies CP, SOB, DOE, treatment intolerance or new symptoms. DUe for lab (bmp, cbc) - declined today Refuses all vaccines  AWV 01/17/17  HTN: -meds: asa, hctz  Hyperlipidemia/Obesity: -wt last visit 180 (3/18) --> 175 (6/18) --> 176 today  ROS: See pertinent positives and negatives per HPI.  Past Medical History:  Diagnosis Date  . Arthritis   . Essential hypertension 05/07/2015  . Obesity (BMI 30-39.9) 05/07/2015  . Osteoarthritis of left hip     Past Surgical History:  Procedure Laterality Date  . ABDOMINAL HYSTERECTOMY  1977  . TONSILLECTOMY AND ADENOIDECTOMY  1939  . TOTAL HIP ARTHROPLASTY Left 03/19/2013   Procedure: LEFT TOTAL HIP ARTHROPLASTY ANTERIOR APPROACH;  Surgeon: [name], MD;  Location: [facility];  Service: Orthopedics;  Laterality: Left;    No family history on file.  Social History   Social History  . Marital status: Married    Spouse name: N/A  . Number of children: N/A  . Years of education: N/A   Social History Main Topics  . Smoking status: Never Smoker  . Smokeless tobacco: Never Used  . Alcohol use 8.4 oz/week    14 Glasses of wine per week     Comment: 1-2 glasses of wine daily   . Drug use: No  . Sexual activity: Not Asked   Other Topics Concern  . 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  aspirin 81 MG tablet, Take 81 mg by mouth daily., Disp: , Rfl:  .  calcium gluconate 500 MG tablet, Take 500 mg by mouth daily., Disp: , Rfl:  .  hydrochlorothiazide (HYDRODIURIL) 12.5 MG tablet, TAKE 1 TABLET (12.5 MG TOTAL) BY MOUTH DAILY., Disp: 90 tablet, Rfl: 1 .  Ibuprofen 200 MG CAPS, Take 400 mg by mouth as needed. , Disp: , Rfl:   EXAM:  Vitals:   05/09/17 0841  BP: 136/78  Pulse: 86  Temp: 98 F (36.7 C)    Body mass index is 32.32 kg/m.  GENERAL: vitals reviewed and listed above, alert, oriented, appears well hydrated and in no acute distress  HEENT: atraumatic, conjunttiva clear, no obvious abnormalities on inspection of external nose and ears  NECK: no obvious masses on inspection  LUNGS: clear to auscultation bilaterally, no wheezes, rales or rhonchi, good air movement  CV: HRRR, no peripheral edema  MS: moves all extremities without noticeable abnormality, mild tenderness to palpation in the bilateral lumbar or paraspinal muscles and upper glutes, seems to have normal function of lower  extremities bilaterally, no bony tenderness to palpation  PSYCH: pleasant and cooperative, no obvious depression or anxiety  ASSESSMENT AND PLAN:  Discussed the following assessment and plan:  Essential hypertension  Obesity (BMI 30-39.9)  Hyperlipidemia, unspecified hyperlipidemia type  Chronic bilateral low back pain without sciatica  -Offered evaluation and treatment for the low back pain, she really does not want this and only requests home exercises, these were provided she was advised to follow-up if her symptoms worsen or persist -she declines lab work or vaccines today -Lifestyle recommendations -Continue current treatment -Follow up 3-4 months -Patient  advised to return or notify a doctor immediately if symptoms worsen or persist or new concerns arise.  Patient Instructions  BEFORE YOU LEAVE: -follow up: 3- 4 months -low back exercises  Do the low back exercises 4 days per week.  Advise regular aerobic exercise (at least 150 minutes per week of sweaty exercise) and a healthy diet. Try to eat at least 5-9 servings of vegetables and fruits per day (not corn, potatoes or bananas.) Avoid sweets, red meat, pork, butter, fried foods, fast food, processed food, excessive dairy, eggs and coconut. Replace bad fats with good fats - fish, nuts and seeds, canola oil, olive oil.       Kriste Basque R., DO

## 2017-08-03 ENCOUNTER — Encounter: Payer: Self-pay | Admitting: Family Medicine

## 2017-09-01 ENCOUNTER — Encounter: Payer: Self-pay | Admitting: Family Medicine

## 2017-09-03 ENCOUNTER — Encounter: Payer: Self-pay | Admitting: Family Medicine

## 2017-09-04 NOTE — Progress Notes (Signed)
HPI:  Lori Ramirez is a pleasant 84 y.o. here for follow up. Chronic medical problems summarized below were reviewed for changes and stability and were updated as needed below. These issues and their treatment remain stable for the most part.  Reports she is doing well for the most part.  Continues to try to eat healthier and has continued a little weight.  She tries to stay active.  He does have some pain in her left hip if she walks too long and has a handicap parking sticker for that.  She would like to have a renewal of this.  She has osteoarthritis in her hands and her ankles as well.  Sometimes gets some soreness here and she uses ibuprofen 1-2 times per week for this.  Denies weakness, falls, numbness.  Denies CP, SOB, DOE, treatment intolerance or new symptoms. Due for labs Last annual wellness visit was 01/17/2017  HTN: -meds: asa, hctz  Hyperlipidemia/Obesity: -wt last visit 180 (3/18) -->175 (6/18) --> 174 (2/19)  History of hip replacement: -Has handicap parking sticker for this, as since the surgery, sometimes has pain in this hip that limits her walking long distances  ROS: See pertinent positives and negatives per HPI.  Past Medical History:  Diagnosis Date  . Arthritis   . Essential hypertension 05/07/2015  . Obesity (BMI 30-39.9) 05/07/2015  . Osteoarthritis of left hip     Past Surgical History:  Procedure Laterality Date  . ABDOMINAL HYSTERECTOMY  1977  . TONSILLECTOMY AND ADENOIDECTOMY  1939  . TOTAL HIP ARTHROPLASTY Left 03/19/2013   Procedure: LEFT TOTAL HIP ARTHROPLASTY ANTERIOR APPROACH;  Surgeon: Lori PalMatthew D Olin, MD;  Location: [facility];  Service: Orthopedics;  Laterality: Left;    History reviewed. No pertinent family history.  Social History   Socioeconomic History  . Marital status: Married    Spouse name: None  . Number of children: None  . Years of education: None  . Highest education level: None  Social Needs  . Financial resource strain:  None  . Food insecurity - worry: None  . Food insecurity - inability: None  . Transportation needs - medical: None  . Transportation needs - non-medical: None  Occupational History  . None  Tobacco Use  . Smoking status: Never Smoker  . Smokeless tobacco: Never Used  Substance and Sexual Activity  . Alcohol use: Yes    Alcohol/week: 8.4 oz    Types: 14 Glasses of wine per week    Comment: 1-2 glasses of wine daily   . Drug use: No  . Sexual activity: None  Other Topics Concern  . 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  aspirin 81 MG tablet, Take 81 mg by mouth daily., Disp: , Rfl:  .  calcium gluconate 500 MG tablet, Take 500 mg by mouth daily., Disp: , Rfl:  .  hydrochlorothiazide (HYDRODIURIL) 12.5 MG tablet, TAKE 1 TABLET (12.5 MG TOTAL) BY MOUTH DAILY., Disp: 90 tablet, Rfl: 1 .  Ibuprofen 200 MG CAPS, Take 400 mg by mouth as needed. , Disp: , Rfl:   EXAM:  Vitals:   09/05/17 0856  BP: 126/78  Pulse: 86  Temp: 98.4 F (36.9 C)    Body mass index is 31.92 kg/m.  GENERAL: vitals reviewed and listed above, alert, oriented, appears well hydrated and in no acute distress  HEENT: atraumatic, conjunttiva clear, no obvious abnormalities on inspection of external nose and ears  NECK: no obvious masses on inspection  LUNGS: clear to auscultation bilaterally, no wheezes, rales or rhonchi, good air movement  CV: HRRR, trace ankle edema  MS: moves all extremities without noticeable abnormality, osteoarthritis of the hands, minimal swelling around the ankles, cautious gait  PSYCH: pleasant and cooperative, no obvious depression or anxiety  ASSESSMENT AND PLAN:  Discussed the following assessment and plan:  Primary  osteoarthritis, unspecified site  Essential hypertension - Plan: Basic metabolic panel, CBC  Hyperlipidemia, unspecified hyperlipidemia type  Obesity (BMI 30-39.9)  Senile purpura (HCC), Chronic  -Suggested compression knees for her lower extremity tissues, mild edema -Okay to use Tylenol or Aleve for pain intermittently, advised against regular daily use if possible -discussed risks -Topical sports creams with Sedation for arthritic pain -Labs today -Patient advised to return or notify a doctor immediately if symptoms worsen or persist or new concerns arise.  Patient Instructions  BEFORE YOU LEAVE: -Labs -follow up: Follow-up for annual wellness visit with Darl Pikes and physical with Dr. Selena Batten after 01/17/2018  We have ordered labs or studies at this visit. It can take up to 1-2 weeks for results and processing. IF results require follow up or explanation, we will call you with instructions. Clinically stable results will be released to your [facility]. If you have not heard from [place] or cannot find your results in [facility] in 2 weeks please contact our office at [phone].  If you are not yet signed up for [facility], please consider signing up.  Can try some topical sports creams with Capsaicin for the arthritis pain.  Using a little ibuprofen or Tylenol from time to time is also okay.   We recommend the following healthy lifestyle for LIFE: 1) Small portions. But, make sure to get regular (at least 3 per day), healthy meals and small healthy snacks if needed.  2) Eat a healthy clean diet.   TRY TO EAT: -at least 5-7 servings of low sugar, colorful, and nutrient rich vegetables per day (not corn, potatoes or bananas.) -berries are the best choice if you wish to eat fruit (only eat small amounts if trying to reduce weight)  -lean meets (fish, white meat of chicken or [place]) -vegan proteins for some meals - beans or tofu, whole grains, nuts and seeds -Replace bad fats with good fats - good  fats include: fish, nuts and seeds, canola oil, olive oil -small amounts of low fat or non fat dairy -small amounts of100 % whole grains - check the lables -drink plenty of water  AVOID: -SUGAR, sweets, anything with added sugar, corn syrup or sweeteners - must read labels as even foods advertised as "healthy" often are loaded with sugar -if you must have a sweetener, small amounts of stevia may be best -sweetened beverages and artificially sweetened beverages -simple starches (rice, bread, potatoes, pasta, chips, etc - small amounts of 100% whole grains are ok) -red meat, pork, butter -fried foods, fast food, processed food, excessive dairy, eggs and coconut.  3)Get at least 150 minutes of sweaty aerobic exercise per week.  4)Reduce stress - consider counseling, meditation and relaxation to balance other aspects of your life.         [name], DO

## 2017-09-05 ENCOUNTER — Encounter: Payer: Self-pay | Admitting: Family Medicine

## 2017-09-05 ENCOUNTER — Ambulatory Visit (INDEPENDENT_AMBULATORY_CARE_PROVIDER_SITE_OTHER): Payer: Medicare Other | Admitting: Family Medicine

## 2017-09-05 VITALS — BP 126/78 | HR 86 | Temp 98.4°F | Ht 62.0 in | Wt 174.5 lb

## 2017-09-05 DIAGNOSIS — M1991 Primary osteoarthritis, unspecified site: Secondary | ICD-10-CM | POA: Diagnosis not present

## 2017-09-05 DIAGNOSIS — E785 Hyperlipidemia, unspecified: Secondary | ICD-10-CM | POA: Diagnosis not present

## 2017-09-05 DIAGNOSIS — I10 Essential (primary) hypertension: Secondary | ICD-10-CM | POA: Diagnosis not present

## 2017-09-05 DIAGNOSIS — D692 Other nonthrombocytopenic purpura: Secondary | ICD-10-CM | POA: Diagnosis not present

## 2017-09-05 DIAGNOSIS — E669 Obesity, unspecified: Secondary | ICD-10-CM

## 2017-09-05 LAB — BASIC METABOLIC PANEL
BUN: 20 mg/dL (ref 6–23)
CALCIUM: 9.7 mg/dL (ref 8.4–10.5)
CO2: 33 mEq/L — ABNORMAL HIGH (ref 19–32)
Chloride: 97 mEq/L (ref 96–112)
Creatinine, Ser: 0.83 mg/dL (ref 0.40–1.20)
GFR: 69.58 mL/min (ref >60.00)
GLUCOSE: 98 mg/dL (ref 70–99)
POTASSIUM: 4 meq/L (ref 3.5–5.1)
SODIUM: 136 meq/L (ref 135–145)

## 2017-09-05 LAB — CBC
HCT: 42.9 % (ref 36.0–46.0)
HEMOGLOBIN: 14.9 g/dL (ref 12.0–15.0)
MCHC: 34.7 g/dL (ref 30.0–36.0)
MCV: 96.2 fl (ref 78.0–100.0)
PLATELETS: 169 10*3/uL (ref 150.0–400.0)
RBC: 4.46 Mil/uL (ref 3.87–5.11)
RDW: 14 % (ref 11.5–15.5)
WBC: 3.3 10*3/uL — ABNORMAL LOW (ref 4.0–10.5)

## 2017-09-05 NOTE — Patient Instructions (Addendum)
BEFORE YOU LEAVE: -Labs -follow up: Follow-up for annual wellness visit with Lori Ramirez and physical with Dr. Selena Ramirez after 01/17/2018  We have ordered labs or studies at this visit. It can take up to 1-2 weeks for results and processing. IF results require follow up or explanation, we will call you with instructions. Clinically stable results will be released to your [facility]. If you have not heard from us or cannot find your results in [facility] in 2 weeks please contact our office at [phone].  If you are not yet signed up for [facility], please consider signing up.  Can try some topical sports creams with Capsaicin for the arthritis pain.  Using a little ibuprofen or Tylenol from time to time is also okay.   We recommend the following healthy lifestyle for LIFE: 1) Small portions. But, make sure to get regular (at least 3 per day), healthy meals and small healthy snacks if needed.  2) Eat a healthy clean diet.   TRY TO EAT: -at least 5-7 servings of low sugar, colorful, and nutrient rich vegetables per day (not corn, potatoes or bananas.) -berries are the best choice if you wish to eat fruit (only eat small amounts if trying to reduce weight)  -lean meets (fish, white meat of chicken or [place]) -vegan proteins for some meals - beans or tofu, whole grains, nuts and seeds -Replace bad fats with good fats - good fats include: fish, nuts and seeds, canola oil, olive oil -small amounts of low fat or non fat dairy -small amounts of100 % whole grains - check the lables -drink plenty of water  AVOID: -SUGAR, sweets, anything with added sugar, corn syrup or sweeteners - must read labels as even foods advertised as "healthy" often are loaded with sugar -if you must have a sweetener, small amounts of stevia may be best -sweetened beverages and artificially sweetened beverages -simple starches (rice, bread, potatoes, pasta, chips, etc - small amounts of 100% whole grains are ok) -red meat, pork,  butter -fried foods, fast food, processed food, excessive dairy, eggs and coconut.  3)Get at least 150 minutes of sweaty aerobic exercise per week.  4)Reduce stress - consider counseling, meditation and relaxation to balance other aspects of your life.

## 2017-09-18 ENCOUNTER — Other Ambulatory Visit: Payer: Self-pay | Admitting: *Deleted

## 2017-09-18 MED ORDER — HYDROCHLOROTHIAZIDE 12.5 MG PO TABS: 12.5000 mg | ORAL_TABLET | Freq: Every day | ORAL | 1 refills | Status: DC

## 2017-09-18 NOTE — Telephone Encounter (Signed)
Rx done. 

## 2018-01-17 NOTE — Progress Notes (Signed)
Medicare Annual Preventive Care Visit  (initial annual wellness or annual wellness exam)  Concerns and/or follow up today:   Lori Ramirez is a pleasant 84 y.o. here for her annual exam and follow up. Chronic medical problems summarized below were reviewed for changes and stability and were updated as needed below. These issues and their treatment remain stable for the most part.  She reports she is doing great and is without any complaints today.  She is walking on a regular basis about 4 to 5 days/week.  She reports her diet is "pretty good".  She is excited about a trip to [place] in a cruise that is planned for later this year.  She refuses all vaccines and a bone density test today.  Also refuses a breast or skin exam.  She does agree to check labs and would be agreeable to cholesterol medication if needed for her discussion today. Denies CP, SOB, DOE, treatment intolerance or new symptoms. Due for labs  HTN: -meds: asa, hctz  Hyperlipidemia/Obesity/Hyperglycemia: -wt last visit 180 (3/18) -->175 (6/18) --> 173 12/2017  See HM section in Epic for other details of completed HM. See scanned documentation under Media Tab for further documentation HPI, health risk assessment. See Media Tab and Care Teams sections in Epic for other providers.  ROS: negative for report of fevers, unintentional weight loss, vision changes, vision loss, hearing loss or change, chest pain, sob, hemoptysis, melena, hematochezia, hematuria, genital discharge or lesions, falls, bleeding or bruising, loc, thoughts of suicide or self harm, memory loss  1.) Patient-completed health risk assessment  - completed and reviewed, see scanned documentation  2.) Review of Medical History: -PMH, PSH, Family History and current specialty and care providers reviewed and updated and listed below  - see scanned in document in chart and below  Past Medical History:  Diagnosis Date  . Arthritis   . Essential hypertension  05/07/2015  . Obesity (BMI 30-39.9) 05/07/2015  . Osteoarthritis of left hip     Past Surgical History:  Procedure Laterality Date  . ABDOMINAL HYSTERECTOMY  1977  . TONSILLECTOMY AND ADENOIDECTOMY  1939  . TOTAL HIP ARTHROPLASTY Left 03/19/2013   Procedure: LEFT TOTAL HIP ARTHROPLASTY ANTERIOR APPROACH;  Surgeon: [name], MD;  Location: [facility];  Service: Orthopedics;  Laterality: Left;    Social History   Socioeconomic History  . Marital status: Married    Spouse name: Not on file  . Number of children: Not on file  . Years of education: Not on file  . Highest education level: Not on file  Occupational History  . Not on file  Social Needs  . Financial resource strain: Not on file  . Food insecurity:    Worry: Not on file    Inability: Not on file  . Transportation needs:    Medical: Not on file    Non-medical: Not on file  Tobacco Use  . Smoking status: Never Smoker  . Smokeless tobacco: Never Used  Substance and Sexual Activity  . Alcohol use: Yes    Alcohol/week: 8.4 oz    Types: 14 Glasses of wine per week    Comment: 1-2 glasses of wine daily   . Drug use: No  . Sexual activity: Not on file  Lifestyle  . Physical activity:    Days per week: Not on file    Minutes per session: Not on file  . Stress: Not on file  Relationships  . Social connections:    Talks  on phone: Not on file    Gets together: Not on file    Attends religious service: Not on file    Active member of club or organization: Not on file    Attends meetings of clubs or organizations: Not on file    Relationship status: Not on file  . Intimate partner violence:    Fear of current or ex partner: Not on file    Emotionally abused: Not on file    Physically abused: Not on file    Forced sexual activity: Not on file  Other Topics Concern  . 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 No pertinent family history.  Current Outpatient Medications on File Prior to Visit  Medication Sig Dispense Refill  . calcium gluconate 500 MG tablet Take 500 mg by mouth daily.    . hydrochlorothiazide (HYDRODIURIL) 12.5 MG tablet Take 1 tablet (12.5 mg total) by mouth daily. 90 tablet 1  . Ibuprofen 200 MG CAPS Take 400 mg by mouth as needed.      No current facility-administered medications on file prior to visit.      3.) Review of functional ability and level of safety:  Any difficulty hearing?  See scanned documentation  History of falling?  See scanned documentation  Any trouble with IADLs - using a phone, using transportation, grocery shopping, preparing meals, doing housework, doing laundry, taking medications and managing money?  See scanned documentation  Advance Directives?  Discussed briefly and offered more resources and detailed discussion with our trained staff.   See summary of recommendations in Patient Instructions below.  4.) Physical Exam Vitals:   01/18/18 0858  BP: 118/76  Pulse: 75  Temp: 98.3 F (36.8 C)   Estimated body mass index is 30.68 kg/m as calculated from the following:   Height as of this encounter: 5\' 3"  (1.6 m).   Weight as of this encounter: 173 lb 3.2 oz (78.6 kg).  EKG (optional): deferred  General: alert, appear well hydrated and in no acute distress  HEENT: visual acuity grossly intact, normal appearance of the oropharynx, normal parents of nasal mucous membranes and ear canals, pupils equal, round and reactive to light and accommodation See vision screen in Epic.  NECK: No appreciable masses  CV: HRRR, trace ankle edema  Lungs: CTA bilaterally  ABD: BS ounds positive, soft, nontender to palpation  BREAST/PELVIC: Refused  SKIN: Refused full skin exam  Psych: pleasant and cooperative, no  obvious depression or anxiety  Cognitive function grossly intact  See patient instructions for recommendations.  Education and counseling regarding the above review of health provided with a plan for the following: -see scanned patient completed form for further details -fall prevention strategies discussed  -healthy lifestyle discussed -importance and resources for completing advanced directives discussed -see patient instructions below for any other recommendations provided  4)The following written screening schedule of preventive measures were reviewed with assessment and plan made per below, orders and patient instructions:      AAA screening done if applicable     Alcohol screening done     Obesity Screening and counseling done     STI screening (Hep C  if born [birth year]) offered and per pt wishes     Tobacco Screening done done       Pneumococcal (PPSV23 -one dose after 64, one before if risk factors), influenza yearly and hepatitis B vaccines (if high risk - end stage renal disease, IV drugs, homosexual men, live in home for mentally retarded, hemophilia receiving factors) ASSESSMENT/PLAN: refused      Screening mammograph (yearly if >40) ASSESSMENT/PLAN: declined      Screening Pap smear/pelvic exam (q2 years) ASSESSMENT/PLAN: n/a      Colorectal cancer screening (FOBT yearly or flex sig q4y or colonoscopy q10y or barium enema q4y) ASSESSMENT/PLAN: n/a      Diabetes outpatient self-management training services ASSESSMENT/PLAN: utd or done      Bone mass measurements(covered q2y if indicated - estrogen def, osteoporosis, hyperparathyroid, vertebral abnormalities, osteoporosis or steroids) ASSESSMENT/PLAN: refused      Screening for glaucoma(q1y if high risk - diabetes, FH, AA and > 50 or hispanic and > 65) ASSESSMENT/PLAN:  Refused, advised if eye exam abnormal to see opthomologist      Medical nutritional therapy for individuals with diabetes or renal  disease ASSESSMENT/PLAN: see orders      Cardiovascular screening blood tests (lipids q5y) ASSESSMENT/PLAN: see orders and labs      Diabetes screening tests ASSESSMENT/PLAN: see orders and labs   7.) Summary:  Medicare annual wellness visit, subsequent -risk factors and conditions per above assessment were discussed and treatment, recommendations and referrals were offered per documentation above and orders and patient instructions.  Screening for depression -negative  Essential hypertension - Plan: Basic metabolic panel, CBC -Blood pressure little elevated on arrival, recheck -Labs today, lifestyle recommendations -Continue blood pressure medication -discussed risks benefits of aspirin therapy, she denies any history of heart problems, strokes or blood.  She discontinue this.  Obesity (BMI 30-39.9) -Sugar diet and regular exercise -Advised weight reduction  Hyperlipidemia, unspecified hyperlipidemia type - Plan: HDL cholesterol, Cholesterol, total -Discussed options, not fasting today, she would be willing to take has very high cholesterol, otherwise lifestyle recommendations  Hyperglycemia - Plan: Hemoglobin A1c Mild elevation recheck today-recheck today   Patient Instructions   BEFORE YOU LEAVE: -eye exam - advise optho evaluation if abnormal -labs -follow up: 3-4 months  You refused your recommended vaccines today. Vaccines help to prevent dangerous and life threatening illnesses. Please let us know if you change your mild regarding you pneumonia vaccine and your tetanus vaccine and we will help.  We have ordered labs or studies at this visit. It can take up to 1-2 weeks for results and processing. IF results require follow up or explanation, we will call you with instructions. Clinically stable results will be released to your [facility]. If you have not heard from [place] or cannot find your results in [facility] in 2 weeks please contact our office at [phone].  If you  are not yet signed up for [facility], please consider signing up.    Ms. Behrend , Thank you for taking time to come for your Medicare Wellness Visit. I appreciate your ongoing commitment to your health goals. Please review the following plan we discussed and let me know if I can assist you in the future.   These are the goals we discussed: Goals    Labs today Reviewed health maintenance due Recommend a healthy low sugar diet and regular aerobic exercise Continue blood pressure medication      This is a list of the screening recommended for you and due dates:  Health Maintenance  Topic Date Due  . Tetanus Vaccine  Patient refused  . DEXA scan (bone density measurement)  Patient refused  . Pneumonia vaccines (2 of 2 - PCV13) Patient refused  . Flu Shot  07/12/2026* Patient refused in the past  *Topic was postponed. The date shown is not the original due date.    We recommend the following healthy lifestyle for LIFE: 1) Small portions. But, make sure to get regular (at least 3 per day), healthy meals and small healthy snacks if needed.  2) Eat a healthy clean diet.   TRY TO EAT: -at least 5-7 servings of low sugar, colorful, and nutrient rich vegetables per day (not corn, potatoes or bananas.) -berries are the best choice if you wish to eat fruit (only eat small amounts if trying to reduce weight)  -lean meets (fish, white meat of chicken or [place]) -vegan proteins for some meals - beans or tofu, whole grains, nuts and seeds -Replace bad fats with good fats - good fats include: fish, nuts and seeds, canola oil, olive oil -small amounts of low fat or non fat dairy -small amounts of100 % whole grains - check the lables -drink plenty of water  AVOID: -SUGAR, sweets, anything with added sugar, corn syrup or sweeteners - must read labels as even foods advertised as "healthy" often are loaded with sugar -if you must have a sweetener, small amounts of stevia may be best -sweetened  beverages and artificially sweetened beverages -simple starches (rice, bread, potatoes, pasta, chips, etc - small amounts of 100% whole grains are ok) -red meat, pork, butter -fried foods, fast food, processed food, excessive dairy, eggs and coconut.  3)Get at least 150 minutes of sweaty aerobic exercise per week.  4)Reduce stress - consider counseling, meditation and relaxation to balance other aspects of your life.        Terressa KoyanagiHannah R Marquon Alcala, DO

## 2018-01-18 ENCOUNTER — Ambulatory Visit (INDEPENDENT_AMBULATORY_CARE_PROVIDER_SITE_OTHER): Payer: Medicare Other | Admitting: Family Medicine

## 2018-01-18 ENCOUNTER — Encounter: Payer: Self-pay | Admitting: Family Medicine

## 2018-01-18 VITALS — BP 118/76 | HR 75 | Temp 98.3°F | Ht 63.0 in | Wt 173.2 lb

## 2018-01-18 DIAGNOSIS — R739 Hyperglycemia, unspecified: Secondary | ICD-10-CM | POA: Diagnosis not present

## 2018-01-18 DIAGNOSIS — I10 Essential (primary) hypertension: Secondary | ICD-10-CM

## 2018-01-18 DIAGNOSIS — Z1331 Encounter for screening for depression: Secondary | ICD-10-CM | POA: Diagnosis not present

## 2018-01-18 DIAGNOSIS — E785 Hyperlipidemia, unspecified: Secondary | ICD-10-CM

## 2018-01-18 DIAGNOSIS — E669 Obesity, unspecified: Secondary | ICD-10-CM

## 2018-01-18 DIAGNOSIS — Z0000 Encounter for general adult medical examination without abnormal findings: Secondary | ICD-10-CM | POA: Diagnosis not present

## 2018-01-18 LAB — CHOLESTEROL, TOTAL: CHOLESTEROL: 278 mg/dL — AB (ref 0–200)

## 2018-01-18 LAB — CBC
HCT: 42 % (ref 36.0–46.0)
HEMOGLOBIN: 14.7 g/dL (ref 12.0–15.0)
MCHC: 35 g/dL (ref 30.0–36.0)
MCV: 96.7 fl (ref 78.0–100.0)
Platelets: 180 10*3/uL (ref 150.0–400.0)
RBC: 4.34 Mil/uL (ref 3.87–5.11)
RDW: 13.9 % (ref 11.5–15.5)
WBC: 3.3 10*3/uL — ABNORMAL LOW (ref 4.0–10.5)

## 2018-01-18 LAB — BASIC METABOLIC PANEL
BUN: 28 mg/dL — AB (ref 6–23)
CALCIUM: 9.8 mg/dL (ref 8.4–10.5)
CO2: 32 mEq/L (ref 19–32)
Chloride: 98 mEq/L (ref 96–112)
Creatinine, Ser: 0.81 mg/dL (ref 0.40–1.20)
GFR: 71.5 mL/min (ref >60.00)
GLUCOSE: 94 mg/dL (ref 70–99)
Potassium: 4.3 mEq/L (ref 3.5–5.1)
Sodium: 137 mEq/L (ref 135–145)

## 2018-01-18 LAB — HEMOGLOBIN A1C: Hgb A1c MFr Bld: 5.5 % (ref 4.6–6.5)

## 2018-01-18 LAB — HDL CHOLESTEROL: HDL: 122.8 mg/dL (ref >39.00)

## 2018-01-18 NOTE — Patient Instructions (Signed)
BEFORE YOU LEAVE: -eye exam - advise optho evaluation if abnormal -labs -follow up: 3-4 months  You refused your recommended vaccines today. Vaccines help to prevent dangerous and life threatening illnesses. Please let us know if you change your mild regarding you pneumonia vaccine and your tetanus vaccine and we will help.  We have ordered labs or studies at this visit. It can take up to 1-2 weeks for results and processing. IF results require follow up or explanation, we will call you with instructions. Clinically stable results [facility]. If you have not heard from us or cannot find your results in [facility] in 2 weeks please contact our office at [phone].  If you are not yet signed [facility], please consider signing up.    Ms. Schlereth , Thank you for taking time to come for your Medicare Wellness Visit. I appreciate your ongoing commitment to your health goals. Please review the following plan we discussed and let me know if I can assist you in the future.   These are the goals we discussed: Goals    Labs today Reviewed health maintenance due Recommend a healthy low sugar diet and regular aerobic exercise Continue blood pressure medication      This is a list of the screening recommended for you and due dates:  Health Maintenance  Topic Date Due  . Tetanus Vaccine  Patient refused  . DEXA scan (bone density measurement)  Patient refused  . Pneumonia vaccines (2 of 2 - PCV13) Patient refused  . Flu Shot  07/12/2026* Patient refused in the past  *Topic was postponed. The date shown is not the original due date.    We recommend the following healthy lifestyle for LIFE: 1) Small portions. But, make sure to get regular (at least 3 per day), healthy meals and small healthy snacks if needed.  2) Eat a healthy clean diet.   TRY TO EAT: -at least 5-7 servings of low sugar, colorful, and nutrient rich vegetables per day (not corn, potatoes or  bananas.) -berries are the best choice if you wish to eat fruit (only eat small amounts if trying to reduce weight)  -lean meets (fish, white meat of chicken or [place]) -vegan proteins for some meals - beans or tofu, whole grains, nuts and seeds -Replace bad fats with good fats - good fats include: fish, nuts and seeds, canola oil, olive oil -small amounts of low fat or non fat dairy -small amounts of100 % whole grains - check the lables -drink plenty of water  AVOID: -SUGAR, sweets, anything with added sugar, corn syrup or sweeteners - must read labels as even foods advertised as "healthy" often are loaded with sugar -if you must have a sweetener, small amounts of stevia may be best -sweetened beverages and artificially sweetened beverages -simple starches (rice, bread, potatoes, pasta, chips, etc - small amounts of 100% whole grains are ok) -red meat, pork, butter -fried foods, fast food, processed food, excessive dairy, eggs and coconut.  3)Get at least 150 minutes of sweaty aerobic exercise per week.  4)Reduce stress - consider counseling, meditation and relaxation to balance other aspects of your life.

## 2018-03-16 ENCOUNTER — Other Ambulatory Visit: Payer: Self-pay | Admitting: Family Medicine

## 2018-05-23 NOTE — Progress Notes (Signed)
HPI:  Using dictation device. Unfortunately this device frequently misinterprets words/phrases.  Lori Ramirez is a pleasant 84 y.o. here for follow up. Chronic medical problems summarized below were reviewed for changes and stability and were updated as needed below. These issues and their treatment remain stable for the most part.  Reports doing well for the most part.  She has not been exercising as much.  [facility]  She has chronic low back pain.  This is been going on for many years and is unchanged.  Pain in the right low back.  This feels better with ibuprofen and also with being more active.  Trying to eat healthy.  Denies CP, swelling, radiation, numbness, SOB, DOE, treatment intolerance or new symptoms. Due for fasting labs, but not fasting  AWV 01/18/18 with Dr. Selena Batten  HTN: -meds: asa, hctz  Hyperlipidemia/Obesity/Hyperglycemia: -wt last visit 180(3/18)-->175(6/18)--> 173 12/2017 --> 179 10/19  ROS: See pertinent positives and negatives per HPI.  Past Medical History:  Diagnosis Date  . Arthritis   . Essential hypertension 05/07/2015  . Obesity (BMI 30-39.9) 05/07/2015  . Osteoarthritis of left hip     Past Surgical History:  Procedure Laterality Date  . ABDOMINAL HYSTERECTOMY  1977  . TONSILLECTOMY AND ADENOIDECTOMY  1939  . TOTAL HIP ARTHROPLASTY Left 03/19/2013   Procedure: LEFT TOTAL HIP ARTHROPLASTY ANTERIOR APPROACH;  Surgeon: [name], MD;  Location: [facility];  Service: Orthopedics;  Laterality: Left;    History reviewed. No pertinent family history.  SOCIAL HX: See HPI   Current Outpatient Medications:  .  calcium gluconate 500 MG tablet, Take 500 mg by mouth daily., Disp: , Rfl:  .  hydrochlorothiazide (HYDRODIURIL) 12.5 MG tablet, TAKE 1 TABLET BY MOUTH EVERY DAY, Disp: 90 tablet, Rfl: 1  EXAM:  Vitals:   05/24/18 0855  BP: 120/78  Pulse: 85  Temp: 98.1 F (36.7 C)  SpO2: 96%    Body mass index is  31.74 kg/m.  GENERAL: vitals reviewed and listed above, alert, oriented, appears well hydrated and in no acute distress  HEENT: atraumatic, conjunttiva clear, no obvious abnormalities on inspection of external nose and ears  NECK: no obvious masses on inspection  LUNGS: clear to auscultation bilaterally, no wheezes, rales or rhonchi, good air movement  CV: HRRR, no peripheral edema  MS: moves all extremities without noticeable abnormality, gait is careful  PSYCH: pleasant and cooperative, no obvious depression or anxiety  ASSESSMENT AND PLAN:  Discussed the following assessment and plan:  Essential hypertension - Plan: Basic metabolic panel, CBC  Hyperlipidemia, unspecified hyperlipidemia type  Chronic bilateral low back pain without sciatica  Obesity (BMI 30-39.9)  -Not fasting today, will plan fasting lipid panel next visit -Recommended getting back into the line discussed various options for seniors, silver sneakers, etc. -if this will help with the weight, her metabolic function and also with her back pain -Discussed options for further evaluation and management of the back pain.  For now she will start with trying get more exercise and Tylenol as needed.  She takes something for this pain only rarely encouraged her to continue to use as little as possible. -Fall precautions discussed -Discussed dietary changes she can make to reduce weight -Follow-up 3 to 4 months, sooner as needed   Patient Instructions  BEFORE YOU LEAVE: -labs -follow up: COME FASTING to next visit in 3 months  We have ordered labs or studies at this visit. It  can take up to 1-2 weeks for results and processing. IF results require follow up or explanation, we will call you with instructions. Clinically stable results will be released to your [facility]. If you have not heard from [place] or cannot find your results in [facility] in 2 weeks please contact our office at [phone].  If you are not yet signed up  for [facility], please consider signing up.  Get back into silver sneakers or another senior program or [facility] - try to go at least 4-5 days per week.  Heat or tylenol per instructions as needed for back pain. Let us know if you want to do physical therapy for this.    We recommend the following healthy lifestyle for LIFE: 1) Small portions. But, make sure to get regular (at least 3 per day), healthy meals and small healthy snacks if needed.  2) Eat a healthy clean diet.   TRY TO EAT: -at least 5-7 servings of low sugar, colorful, and nutrient rich vegetables per day (not corn, potatoes or bananas.) -berries are the best choice if you wish to eat fruit (only eat small amounts if trying to reduce weight)  -lean meets (fish, white meat of chicken or [place]) -vegan proteins for some meals - beans or tofu, whole grains, nuts and seeds -Replace bad fats with good fats - good fats include: fish, nuts and seeds, canola oil, olive oil -small amounts of low fat or non fat dairy -small amounts of100 % whole grains - check the lables -drink plenty of water  AVOID: -SUGAR, sweets, anything with added sugar, corn syrup or sweeteners - must read labels as even foods advertised as "healthy" often are loaded with sugar -if you must have a sweetener, small amounts of stevia may be best -sweetened beverages and artificially sweetened beverages -simple starches (rice, bread, potatoes, pasta, chips, etc - small amounts of 100% whole grains are ok) -red meat, pork, butter -fried foods, fast food, processed food, excessive dairy, eggs and coconut.  3)Get at least 150 minutes of sweaty aerobic exercise per week.  4)Reduce stress - consider counseling, meditation and relaxation to balance other aspects of your life.         [name], DO

## 2018-05-24 ENCOUNTER — Encounter: Payer: Self-pay | Admitting: Family Medicine

## 2018-05-24 ENCOUNTER — Ambulatory Visit: Payer: Medicare Other | Admitting: Family Medicine

## 2018-05-24 VITALS — BP 120/78 | HR 85 | Temp 98.1°F | Ht 63.0 in | Wt 179.2 lb

## 2018-05-24 DIAGNOSIS — M545 Low back pain: Secondary | ICD-10-CM | POA: Diagnosis not present

## 2018-05-24 DIAGNOSIS — G8929 Other chronic pain: Secondary | ICD-10-CM

## 2018-05-24 DIAGNOSIS — E785 Hyperlipidemia, unspecified: Secondary | ICD-10-CM

## 2018-05-24 DIAGNOSIS — I10 Essential (primary) hypertension: Secondary | ICD-10-CM

## 2018-05-24 DIAGNOSIS — E669 Obesity, unspecified: Secondary | ICD-10-CM

## 2018-05-24 LAB — CBC
HCT: 40.6 % (ref 36.0–46.0)
Hemoglobin: 14.4 g/dL (ref 12.0–15.0)
MCHC: 35.4 g/dL (ref 30.0–36.0)
MCV: 96.3 fl (ref 78.0–100.0)
PLATELETS: 182 10*3/uL (ref 150.0–400.0)
RBC: 4.21 Mil/uL (ref 3.87–5.11)
RDW: 13.8 % (ref 11.5–15.5)
WBC: 3.1 10*3/uL — AB (ref 4.0–10.5)

## 2018-05-24 LAB — BASIC METABOLIC PANEL
BUN: 21 mg/dL (ref 6–23)
CHLORIDE: 98 meq/L (ref 96–112)
CO2: 34 mEq/L — ABNORMAL HIGH (ref 19–32)
Calcium: 9.7 mg/dL (ref 8.4–10.5)
Creatinine, Ser: 0.83 mg/dL (ref 0.40–1.20)
GFR: 69.46 mL/min (ref >60.00)
Glucose, Bld: 103 mg/dL — ABNORMAL HIGH (ref 70–99)
POTASSIUM: 4 meq/L (ref 3.5–5.1)
SODIUM: 137 meq/L (ref 135–145)

## 2018-05-24 NOTE — Patient Instructions (Addendum)
BEFORE YOU LEAVE: -labs -follow up: COME FASTING to next visit in 3 months  We have ordered labs or studies at this visit. It can take up to 1-2 weeks for results and processing. IF results require follow up or explanation, we will call you with instructions. Clinically stable results will be released to your [facility]. If you have not heard from [place] or cannot find your results in [facility] in 2 weeks please contact our office at [phone].  If you are not yet signed up for [facility], please consider signing up.  Get back into silver sneakers or another senior program or [facility] - try to go at least 4-5 days per week.  Heat or tylenol per instructions as needed for back pain. Let us know if you want to do physical therapy for this.    We recommend the following healthy lifestyle for LIFE: 1) Small portions. But, make sure to get regular (at least 3 per day), healthy meals and small healthy snacks if needed.  2) Eat a healthy clean diet.   TRY TO EAT: -at least 5-7 servings of low sugar, colorful, and nutrient rich vegetables per day (not corn, potatoes or bananas.) -berries are the best choice if you wish to eat fruit (only eat small amounts if trying to reduce weight)  -lean meets (fish, white meat of chicken or [place]) -vegan proteins for some meals - beans or tofu, whole grains, nuts and seeds -Replace bad fats with good fats - good fats include: fish, nuts and seeds, canola oil, olive oil -small amounts of low fat or non fat dairy -small amounts of100 % whole grains - check the lables -drink plenty of water  AVOID: -SUGAR, sweets, anything with added sugar, corn syrup or sweeteners - must read labels as even foods advertised as "healthy" often are loaded with sugar -if you must have a sweetener, small amounts of stevia may be best -sweetened beverages and artificially sweetened beverages -simple starches (rice, bread, potatoes, pasta, chips, etc - small amounts of 100%  whole grains are ok) -red meat, pork, butter -fried foods, fast food, processed food, excessive dairy, eggs and coconut.  3)Get at least 150 minutes of sweaty aerobic exercise per week.  4)Reduce stress - consider counseling, meditation and relaxation to balance other aspects of your life.

## 2018-06-14 DIAGNOSIS — M25551 Pain in right hip: Secondary | ICD-10-CM | POA: Insufficient documentation

## 2018-08-12 NOTE — Progress Notes (Signed)
HPI:  Using dictation device. Unfortunately this device frequently misinterprets words/phrases.  Lori Ramirez is a pleasant 85 y.o. here for follow up. Chronic medical problems summarized below were reviewed for changes and stability and were updated as needed below. These issues and their treatment remain stable for the most part.  Reports doing well except skin lesion R lower lat leg for about 3-4 months that will not heal. No pain or pruritis. Denies hx trauma here or hx skin cancer. Denies CP, SOB, DOE, treatment intolerance or new symptoms.   AWV 01/18/18 with Dr. Selena Ramirez  HTN: -meds: asa, hctz  Hyperlipidemia/Obesity/Hyperglycemia: -wt last visit 180(3/18)-->175(6/18)--> 1736/2019 --> 179 10/19 --> 174 (1/20)  ROS: See pertinent positives and negatives per HPI.  Past Medical History:  Diagnosis Date  . Arthritis   . Essential hypertension 05/07/2015  . Obesity (BMI 30-39.9) 05/07/2015  . Osteoarthritis of left hip     Past Surgical History:  Procedure Laterality Date  . ABDOMINAL HYSTERECTOMY  1977  . TONSILLECTOMY AND ADENOIDECTOMY  1939  . TOTAL HIP ARTHROPLASTY Left 03/19/2013   Procedure: LEFT TOTAL HIP ARTHROPLASTY ANTERIOR APPROACH;  Surgeon: [name], MD;  Location: [facility];  Service: Orthopedics;  Laterality: Left;    History reviewed. No pertinent family history.  SOCIAL HX: see hpi   Current Outpatient Medications:  .  calcium gluconate 500 MG tablet, Take 500 mg by mouth daily., Disp: , Rfl:  .  hydrochlorothiazide (HYDRODIURIL) 12.5 MG tablet, TAKE 1 TABLET BY MOUTH EVERY DAY, Disp: 90 tablet, Rfl: 1  EXAM:  Vitals:   08/14/18 0915 08/14/18 0917  BP: 122/74 122/74  Pulse: 71   Temp: 97.9 F (36.6 C)     Body mass index is 30.84 kg/m.  GENERAL: vitals reviewed and listed above, alert, oriented, appears well hydrated and in no acute distress  HEENT: atraumatic, conjunttiva clear, no obvious abnormalities on inspection of external  nose and ears  NECK: no obvious masses on inspection  LUNGS: clear to auscultation bilaterally, no wheezes, rales or rhonchi, good air movement  CV: HRRR, no peripheral edema  SKIN: 10x16mm scab with erythematous base R lower lateral leg  MS: moves all extremities without noticeable abnormality  PSYCH: pleasant and cooperative, no obvious depression or anxiety  ASSESSMENT AND PLAN:  Discussed the following assessment and plan:  Skin lesion of right leg - Plan: Ambulatory referral to Dermatology -we discussed possible serious and likely etiologies, workup and treatment, treatment risks and return precautions - advised prompt dermatology evlauation -after this discussion, Lori Ramirez agreed to referral, top abx ointment and close monitoring in the interim -follow up advised if worsening prior to derm eval, advised to call our office if does not have derm appt details in next 1 week -of course, we advised Lori Ramirez  to return or notify a doctor immediately if symptoms worsen or persist or new concerns arise.  Hyperlipidemia, unspecified hyperlipidemia type Essential hypertension Obesity (BMI 30-39.9) -labs per orders, lifestyle recs, cont current meds  She refuses all vaccines.  -Patient advised to return or notify a doctor immediately if symptoms worsen or persist or new concerns arise.  Patient Instructions  BEFORE YOU LEAVE: -lab for lipid panel -urgent referral to dermatology placed -follow up: 3-4 months follow up, AWV in June  -We placed a referral for you as discussed to the dermatologist for the skin lesion. It usually takes a few days to process and schedule this referral. If you have not heard from [place] regarding this appointment  in 1 week please contact our office.  We have ordered labs or studies at this visit. It can take up to 1-2 weeks for results and processing. IF results require follow up or explanation, we will call you with instructions. Clinically stable results will be  released to your [facility]. If you have not heard from [place] or cannot find your results in [facility] in 2 weeks please contact our office at [phone].  If you are not yet signed up for [facility], please consider signing up.           Lori Koyanagi, DO

## 2018-08-14 ENCOUNTER — Encounter: Payer: Self-pay | Admitting: Family Medicine

## 2018-08-14 ENCOUNTER — Ambulatory Visit: Payer: Medicare Other | Admitting: Family Medicine

## 2018-08-14 VITALS — BP 122/74 | HR 71 | Temp 97.9°F | Ht 63.0 in | Wt 174.1 lb

## 2018-08-14 DIAGNOSIS — E669 Obesity, unspecified: Secondary | ICD-10-CM | POA: Diagnosis not present

## 2018-08-14 DIAGNOSIS — E785 Hyperlipidemia, unspecified: Secondary | ICD-10-CM

## 2018-08-14 DIAGNOSIS — L989 Disorder of the skin and subcutaneous tissue, unspecified: Secondary | ICD-10-CM | POA: Diagnosis not present

## 2018-08-14 DIAGNOSIS — I10 Essential (primary) hypertension: Secondary | ICD-10-CM | POA: Diagnosis not present

## 2018-08-14 LAB — CBC
HEMATOCRIT: 42.4 % (ref 36.0–46.0)
HEMOGLOBIN: 14.8 g/dL (ref 12.0–15.0)
MCHC: 34.9 g/dL (ref 30.0–36.0)
MCV: 96.7 fl (ref 78.0–100.0)
PLATELETS: 185 10*3/uL (ref 150.0–400.0)
RBC: 4.39 Mil/uL (ref 3.87–5.11)
RDW: 14.3 % (ref 11.5–15.5)
WBC: 3.6 10*3/uL — ABNORMAL LOW (ref 4.0–10.5)

## 2018-08-14 LAB — BASIC METABOLIC PANEL
BUN: 19 mg/dL (ref 6–23)
CHLORIDE: 97 meq/L (ref 96–112)
CO2: 31 mEq/L (ref 19–32)
CREATININE: 0.82 mg/dL (ref 0.40–1.20)
Calcium: 10.2 mg/dL (ref 8.4–10.5)
GFR: 66.23 mL/min (ref >60.00)
Glucose, Bld: 107 mg/dL — ABNORMAL HIGH (ref 70–99)
POTASSIUM: 4.4 meq/L (ref 3.5–5.1)
SODIUM: 136 meq/L (ref 135–145)

## 2018-08-14 LAB — LIPID PANEL
Cholesterol: 291 mg/dL — ABNORMAL HIGH (ref 0–200)
HDL: 123.1 mg/dL (ref >39.00)
LDL Cholesterol: 156 mg/dL — ABNORMAL HIGH (ref 0–99)
NONHDL: 167.55
Total CHOL/HDL Ratio: 2
Triglycerides: 58 mg/dL (ref 0.0–149.0)
VLDL: 11.6 mg/dL (ref 0.0–40.0)

## 2018-08-14 NOTE — Patient Instructions (Addendum)
BEFORE YOU LEAVE: -lab for lipid panel -urgent referral to dermatology placed -follow up: 3-4 months follow up, AWV in June  -We placed a referral for you as discussed to the dermatologist for the skin lesion. It usually takes a few days to process and schedule this referral. If you have not heard from [place] regarding this appointment in 1 week please contact our office.  We have ordered labs or studies at this visit. It can take up to 1-2 weeks for results and processing. IF results require follow up or explanation, we will call you with instructions. Clinically stable results will be released to your [facility]. If you have not heard from [place] or cannot find your results in [facility] in 2 weeks please contact our office at [phone].  If you are not yet signed up for [facility], please consider signing up.

## 2018-09-06 ENCOUNTER — Telehealth: Payer: Self-pay | Admitting: Family Medicine

## 2018-09-06 NOTE — Telephone Encounter (Signed)
Patient is requesting to transfer care to Dr. Durene Cal, I have already gotten permission from Dr. Durene Cal to transfer care. Please advise if you are alright if we transfer care of patient Lori Ramirez to Dr. Durene Cal. Thanks so much.

## 2018-09-06 NOTE — Telephone Encounter (Signed)
ok 

## 2018-09-11 ENCOUNTER — Other Ambulatory Visit: Payer: Self-pay | Admitting: Family Medicine

## 2018-12-09 ENCOUNTER — Other Ambulatory Visit: Payer: Self-pay | Admitting: Family Medicine

## 2018-12-10 ENCOUNTER — Encounter: Payer: Self-pay | Admitting: Family Medicine

## 2018-12-11 NOTE — Telephone Encounter (Signed)
Spoke to Lori Ramirez and he stated that his wife has memory issues and that's why is is wanting to come in with her. Both pt and husband has been screened for Covid-19 sx. Denies F/C/C, travel or contact with anyone who has or could potentially have Covid-19.

## 2018-12-13 ENCOUNTER — Ambulatory Visit: Payer: Medicare Other | Admitting: Family Medicine

## 2018-12-13 ENCOUNTER — Other Ambulatory Visit: Payer: Self-pay

## 2018-12-13 ENCOUNTER — Encounter: Payer: Self-pay | Admitting: Family Medicine

## 2018-12-13 VITALS — BP 126/70 | HR 85 | Ht 62.5 in | Wt 177.0 lb

## 2018-12-13 VITALS — BP 154/90 | HR 85 | Ht 62.5 in | Wt 177.0 lb

## 2018-12-13 DIAGNOSIS — E785 Hyperlipidemia, unspecified: Secondary | ICD-10-CM

## 2018-12-13 DIAGNOSIS — D692 Other nonthrombocytopenic purpura: Secondary | ICD-10-CM

## 2018-12-13 DIAGNOSIS — E669 Obesity, unspecified: Secondary | ICD-10-CM

## 2018-12-13 DIAGNOSIS — I10 Essential (primary) hypertension: Secondary | ICD-10-CM | POA: Diagnosis not present

## 2018-12-13 MED ORDER — HYDROCHLOROTHIAZIDE 12.5 MG PO TABS: 12.5000 mg | ORAL_TABLET | Freq: Every day | ORAL | 3 refills | Status: DC

## 2018-12-13 NOTE — Assessment & Plan Note (Signed)
S: controlled on hctz 12.5 mg daily previously. Today- poorly controlled for first time in a while. Usually checks once a month at home and has been fine. Already took meds this morning   Patient stated she was told yesterday that she has a staph infection. This maybe the cause of elevated Bp per pt husband. On bactrim DS for a day- seems to be doing better.  BP Readings from Last 3 Encounters:  12/13/18 (!) 154/90  08/14/18 122/74  05/24/18 120/78  A/P: first time elevation we have seen in a while- we are going to do some home monitoring for 1-2 weeks then they will send me a mychart message- and if remains high we can make adjustments

## 2018-12-13 NOTE — Progress Notes (Addendum)
Phone: [phone]   Subjective:  Patient presents today to establish care with me as their new primary care provider. Patient was formerly a patient of Dr. Selena Batten.  Chief Complaint  Patient presents with  . Hypertension   See problem oriented charting ROS- some continued low back pain. Recent skin infection/ulcer- working with dermatology on June 4th.  No chest pain or shortness of breath reported.  The following were reviewed and entered/updated in epic: Past Medical History:  Diagnosis Date  . Arthritis   . Essential hypertension 05/07/2015  . Obesity (BMI 30-39.9) 05/07/2015  . Osteoarthritis of left hip    Patient Active Problem List   Diagnosis Date Noted  . Hyperlipidemia 07/12/2016    Priority: Medium  . Essential hypertension 05/07/2015    Priority: Medium  . Obesity (BMI 30-39.9) 05/07/2015    Priority: Medium  . Chronic bilateral low back pain without sciatica 05/09/2017    Priority: Low  . Senile purpura (HCC) 01/17/2017    Priority: Low  . 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 ABDOMINAL HYSTERECTOMY  1977  . TONSILLECTOMY AND ADENOIDECTOMY  1939  . TOTAL HIP ARTHROPLASTY Left 03/19/2013   Procedure: LEFT TOTAL HIP ARTHROPLASTY ANTERIOR APPROACH;  Surgeon: [name], MD;  Location: [facility];  Service: Orthopedics;  Laterality: Left;    Family History  Problem Relation Age of Onset  . Other Mother        infection of unknown site led to death- 59s  . Other Father        lived to 16  . Healthy Brother     Medications- reviewed and updated Current Outpatient Medications  Medication Sig Dispense Refill  . calcium gluconate 500 MG tablet Take 500 mg by mouth daily.    . clindamycin (CLEOCIN) 300 MG capsule TAKE 3 CAPSULES BY MOUTH 1 HOUR PRIOR TO DENTAL PROCEDURE    . hydrochlorothiazide (HYDRODIURIL) 12.5 MG tablet Take 1 tablet (12.5 mg total) by mouth daily. 90 tablet 3  . mupirocin  ointment (BACTROBAN) 2 % APPLY DAILY TO WOUND W/ DRESSING CHANGES    . sulfamethoxazole-trimethoprim (BACTRIM DS) 800-160 MG tablet      No current facility-administered medications for this visit.     Allergies-reviewed and updated Allergies  Allergen Reactions  . Amoxicillin   . Cortisone Acetate     REACTION: rash  . Iodine     REACTION: rash  . Prednisone     REACTION: rash  . Triamcinolone Acetonide     REACTION: rash    Social History   Social History Narrative   Married 1955- lives with husband who is Dr. Durene Cal ptaient. . 2 daughters. 2 grandsons. 2 greatgrandchildren in 2020.       Retired, was a Tree surgeon Situation: lives with husband      Spiritual Beliefs: protestant      Hobbies: golfing, cruises (outside of covid 19)   Objective  Objective:  BP (!) 154/90   Pulse 85   Ht 5' 2.5" (1.588 m)   Wt 177 lb (80.3 kg)   SpO2 97%   BMI 31.86 kg/m  Gen: NAD wearing a mask, resting comfortably in the chair-able to get onto table without assist- asks for a hand when getting down HEENT: Declined removing mask today Neck: no thyromegaly, no cervical lymphadenopathy CV: RRR no murmurs rubs or gallops Lungs: CTAB no crackles, wheeze, rhonchi  Abdomen: soft/nontender/nondistended/normal bowel sounds.  Ext: Trace edema, bandage on right lower leg noted- minimal erythema surrounding this Skin: warm, dry Neuro: grossly normal, moves all extremities, PERRLA    Assessment and Plan:   #hypertension S: controlled on hctz 12.5 mg daily previously. Today- poorly controlled for first time in a while. Usually checks once a month at home and has been fine. Already took meds this morning   Patient stated she was told yesterday that she has a staph infection. This maybe the cause of elevated Bp per pt husband. On bactrim DS for a day- seems to be doing better.  BP Readings from Last 3 Encounters:  12/13/18 (!) 154/90  08/14/18 122/74  05/24/18 120/78  A/P:  first time elevation we have seen in a while- we are going to do some home monitoring for 1-2 weeks then they will send me a mychart message- and if remains high we can make adjustments  Addendum BP 126/70 Comment: husband monitoring at home for patient- addendum 03/21/2019- #s typically 120s/70s or 80s  Pulse 85   Ht 5' 2.5" (1.588 m)   Wt 177 lb (80.3 kg)   SpO2 97%   BMI 31.86 kg/m    #hyperlipidemia S: poorly controlled on no rx. Outside of suggested age range for primary prevention with statins and no history of CV disease Lab Results  Component Value Date   CHOL 291 (H) 08/14/2018   HDL 123.10 08/14/2018   LDLCALC 156 (H) 08/14/2018   LDLDIRECT 120.5 12/24/2012   TRIG 58.0 08/14/2018   CHOLHDL 2 08/14/2018   A/P: mild poor control- would not start medicine- would recommend healthy eating and regular exercise  # senile purpura S:easy bruisingbleeding noted. Ulcer on her leg just seemed to open up recently A/P: stable- discussed trying to avoid hitting objects if possible. Not on aspirin or blood thinner   # Obesity S:activity has been down with covid 19. Denies change in diet  Wt Readings from Last 3 Encounters:  12/13/18 177 lb (80.3 kg)  08/14/18 174 lb 1.6 oz (79 kg)  05/24/18 179 lb 3.2 oz (81.3 kg)  A/P: weight up slightly- Encouraged need for healthy eating, regular exercise, weight loss.  - glass of wine 1-2x per day- strongly encouraged 1 beverage max per day  Essential hypertension S: controlled on hctz 12.5 mg daily previously. Today- poorly controlled for first time in a while. Usually checks once a month at home and has been fine. Already took meds this morning   Patient stated she was told yesterday that she has a staph infection. This maybe the cause of elevated Bp per pt husband. On bactrim DS for a day- seems to be doing better.  BP Readings from Last 3 Encounters:  12/13/18 (!) 154/90  08/14/18 122/74  05/24/18 120/78  A/P: first time elevation we  have seen in a while- we are going to do some home monitoring for 1-2 weeks then they will send me a mychart message- and if remains high we can make adjustments  Hyperlipidemia S: poorly controlled on no rx. Outside of suggested age range for primary prevention with statins and no history of CV disease Lab Results  Component Value Date   CHOL 291 (H) 08/14/2018   HDL 123.10 08/14/2018   LDLCALC 156 (H) 08/14/2018   LDLDIRECT 120.5 12/24/2012   TRIG 58.0 08/14/2018   CHOLHDL 2 08/14/2018   A/P: mild poor control- would not start medicine- would recommend healthy eating and regular exercise  Patient  was to schedule next available physical Future Appointments  Date Time Provider Department Center  07/02/2019  8:40 AM Shelva MajesticHunter, Calyb Mcquarrie O, MD [facility]   Lab/Order associations: Senile purpura (HCC)  Essential hypertension  Hyperlipidemia, unspecified hyperlipidemia type  Obesity (BMI 30-39.9)  Meds ordered this encounter  Medications  . hydrochlorothiazide (HYDRODIURIL) 12.5 MG tablet    Sig: Take 1 tablet (12.5 mg total) by mouth daily.    Dispense:  90 tablet    Refill:  3   Return precautions advised.  Tana ConchStephen Elva Mauro, MD

## 2018-12-13 NOTE — Patient Instructions (Addendum)
first time elevation we have seen in a while- we are going to do some home monitoring for 2-3 weeks then they will send me a mychart message- and if remains high we can make adjustments  Schedule a physical for 3 to 6 months

## 2018-12-13 NOTE — Assessment & Plan Note (Signed)
S: poorly controlled on no rx. Outside of suggested age range for primary prevention with statins and no history of CV disease Lab Results  Component Value Date   CHOL 291 (H) 08/14/2018   HDL 123.10 08/14/2018   LDLCALC 156 (H) 08/14/2018   LDLDIRECT 120.5 12/24/2012   TRIG 58.0 08/14/2018   CHOLHDL 2 08/14/2018   A/P: mild poor control- would not start medicine- would recommend healthy eating and regular exercise

## 2019-01-01 ENCOUNTER — Ambulatory Visit: Payer: Medicare Other

## 2019-02-22 ENCOUNTER — Telehealth: Payer: Self-pay | Admitting: Family Medicine

## 2019-02-22 NOTE — Telephone Encounter (Signed)
I called to schedule an AWV with Loma Sousa, but the patient's husband declined and said they're not doing it anymore. VDM (DD)

## 2019-06-17 ENCOUNTER — Encounter: Payer: Self-pay | Admitting: Family Medicine

## 2019-06-17 ENCOUNTER — Other Ambulatory Visit: Payer: Self-pay

## 2019-06-17 ENCOUNTER — Ambulatory Visit: Payer: Medicare Other | Admitting: Family Medicine

## 2019-06-17 VITALS — BP 142/100 | HR 97 | Temp 97.2°F | Ht 64.0 in | Wt 174.0 lb

## 2019-06-17 DIAGNOSIS — M25521 Pain in right elbow: Secondary | ICD-10-CM | POA: Diagnosis not present

## 2019-06-17 DIAGNOSIS — I10 Essential (primary) hypertension: Secondary | ICD-10-CM | POA: Diagnosis not present

## 2019-06-17 DIAGNOSIS — R531 Weakness: Secondary | ICD-10-CM | POA: Diagnosis not present

## 2019-06-17 DIAGNOSIS — M25561 Pain in right knee: Secondary | ICD-10-CM | POA: Diagnosis not present

## 2019-06-17 DIAGNOSIS — W19XXXA Unspecified fall, initial encounter: Secondary | ICD-10-CM

## 2019-06-17 NOTE — Patient Instructions (Addendum)
Health Maintenance Due  Topic Date Due  . Lori Ramirez has not had recently  06/24/1950   Monitor blood pressure at home- if continues to be >150/90 over next few days- let me know on Wednesday so I can increase hydrochlorothiazide to 25 mg. Make sure to stay well hydrated. We will recheck next month  If you have worsening knee or elbow pain let me know  Strongly consider chair lift for stairs as well as raised toilet seat to reduce risk of falls.   You wanted to hold off on physical therapy since recently completed   Recommended follow up:  Recheck at 07/02/2019 visit

## 2019-06-17 NOTE — Progress Notes (Signed)
Phone [phone] In person visit   Subjective:   Lori Ramirez is a 85 y.o. year old very pleasant female patient who presents for/with See problem oriented charting Chief Complaint  Patient presents with  . Fall    follow up    ROS- ................................Marland KitchenReview of Systems  Constitutional: Negative.   HENT: Negative.   Eyes: Negative.   Respiratory: Negative.   Cardiovascular: Negative.   Gastrointestinal: Negative.   Genitourinary: Negative.   Musculoskeletal: Positive for back pain (lower back pain sees an orthopedic. had therapy) and falls.  Skin: Negative.   Neurological: Negative.   Endo/Heme/Allergies: Negative.   Psychiatric/Behavioral: Positive for memory loss (onset of dementia per husband).     This visit occurred during the SARS-CoV-2 public health emergency.  Safety protocols were in place, including screening questions prior to the visit, additional usage of staff PPE, and extensive cleaning of exam room while observing appropriate contact time as indicated for disinfecting solutions.   Past Medical History-  Patient Active Problem List   Diagnosis Date Noted  . Hyperlipidemia 07/12/2016    Priority: Medium  . Essential hypertension 05/07/2015    Priority: Medium  . Obesity (BMI 30-39.9) 05/07/2015    Priority: Medium  . Chronic bilateral low back pain without sciatica 05/09/2017    Priority: Low  . Senile purpura (HCC) 01/17/2017    Priority: Low  . S/P left THA, AA 03/19/2013    Priority: Low    Medications- reviewed and updated Current Outpatient Medications  Medication Sig Dispense Refill  . calcium gluconate 500 MG tablet Take 500 mg by mouth daily.    . clindamycin (CLEOCIN) 300 MG capsule TAKE 3 CAPSULES BY MOUTH 1 HOUR PRIOR TO DENTAL PROCEDURE    . hydrochlorothiazide (HYDRODIURIL) 12.5 MG tablet Take 1 tablet (12.5 mg total) by mouth daily. 90 tablet 3   No current facility-administered medications for this visit.       Objective:  BP (!) 142/100   Pulse 97   Temp (!) 97.2 F (36.2 C) (Temporal)   Ht 5\' 4"  (1.626 m)   Wt 174 lb (78.9 kg)   SpO2 95%   BMI 29.87 kg/m  Gen: NAD, resting comfortably CV: RRR no murmurs rubs or gallops Lungs: CTAB no crackles, wheeze, rhonchi  Ext: no edema Skin: warm, dry Neuro: 5- out of 5 strength in bilateral upper and lower extremities MSK: Slight bruising over right elbow and below right knee-no significant pain with palpation    Assessment and Plan  # Fall- right elbow and knee pain S:Patient fell on Saturday night, 06/15/2019 at 9:30 in her bathroom at home. -she went to stand up from toilet and next thing she knew she was falling down. Does not remember dizziness or feeling weak in the legs. Does not think she hit head. Was able to call out to husband immediately. Did have 2 glasses of wine that evening.   -Fell onto right side-  She did hit right elbow and has right leg pain.  Mild aching pain.  She has taken over the counter ibuprofen. She denies any decreased mobility in arm and leg.  -Husband reports she has had some other falls in the last 6 months- last time had stood up from couch and legs gave way. Husband unable to lift her. Since that time, Uses Rolator downstairs and regular walker up stairs. Some trouble with the stairs.    -Cannot get walker into the restroom. Has walked with this ever since last fall  when getting off couch 6 months ago.  -Cannot broaden doorframe - 2 bathrooms she uses in house -Gilford Rile does not fit in the restroom regardless  -6 months ago did 6 weeks of PT given ongoing low back pain -Due to claustrophobia-Doesn't want to do MRI for back  A/P: Right elbow and knee pain-only mild pain today.  Offered x-ray as a precaution but I doubt fracture-patient declined -Primarily be focused on fall prevention today.  She is doing good job using her walker in the house but it does not fit into the restrooms that she uses x2-encouraged using  a raised toilet seat as her fall issues have typically been related to arising from a low seat. -We also discussed potential risk of falling on the stairs-patient denies issues but husband states she sometimes struggles going up the stairs-encouraged them to consider a chairlift or moving into a home without that as a barrier as I doubt issues will get better over the years  # Hypertension  Currently taking HCTZ 12.5mg  daily- took 8 AM.   After her fall it was 179/120 when taken by EMS. Today in office her BP was 160/90 she has had her Blood pressure medications about 2.5 hours ago.  BP Readings from Last 3 Encounters:  06/17/19 (!) 142/100  03/21/19 126/70  08/14/18 122/74   Since last visit averaging 128/80 - so had stopped taking it for last month. This morning systolic # was 235 at home  A/P: Blood pressure is elevated today despite hydrochlorothiazide 12.5 mg-with recent fall I do not feel strongly about increasing her medicine without confirming elevated numbers at home-family is going to monitor at home and let me know by Wednesday morning if blood pressure continues to be elevated and can increase hydrochlorothiazide to 25 mg-would only do this if blood pressure above JNC 8 goal of 150/90.  We also have close follow-up early next month and we can recheck blood pressure as well as home cuff if family would like-they will bring a log of blood pressures to that visit  #Consider MMSE at follow-up due to ROS concern of memory changes per husband  Recommended follow up: Already scheduled Future Appointments  Date Time Provider [place]  07/02/2019  8:40 AM Marin Olp, MD [facility]   Lab/Order associations:   ICD-10-CM   1. Essential hypertension  I10   2. Generalized weakness  R53.1   3. Acute pain of right knee  M25.561   4. Right elbow pain  M25.521   5. Fall, initial encounter  W19.XXXA     Time Stamp The duration of face-to-face time during this visit was greater  than 25 minutes. Greater than 50% of this time was spent in counseling, explanation of diagnosis, planning of further management, and/or coordination of care including blood pressure goals and fall prevention.    Return precautions advised.  Garret Reddish, MD

## 2019-06-19 ENCOUNTER — Encounter: Payer: Self-pay | Admitting: Family Medicine

## 2019-06-24 ENCOUNTER — Encounter: Payer: Self-pay | Admitting: Family Medicine

## 2019-06-24 ENCOUNTER — Other Ambulatory Visit: Payer: Self-pay

## 2019-06-24 MED ORDER — AMLODIPINE BESYLATE 2.5 MG PO TABS: 2.5000 mg | ORAL_TABLET | Freq: Every day | ORAL | 5 refills | Status: DC

## 2019-06-24 NOTE — Telephone Encounter (Signed)
Pts husband called stating he put BP readings in mychart. Please advise.

## 2019-06-28 ENCOUNTER — Telehealth: Payer: Self-pay | Admitting: Family Medicine

## 2019-06-28 NOTE — Telephone Encounter (Signed)
See note  Copied from [place] [id number]. Topic: General - Other >> Jun 28, 2019  3:54 PM Leward Quan A wrote: Reason for CRM: Patient husband called to say that [facility] medical need Dr Yong Channel to print the order for the lift chair on a regular Rx saying Stair lift  instead of what was originally written. He need to pick this up on Monday 07/01/2019 so that the lift can be installed next week and without this new Rx he will not receive the discounted price. Please call Ph# [phone]

## 2019-06-28 NOTE — Telephone Encounter (Signed)
Rx on your desk  

## 2019-06-28 NOTE — Telephone Encounter (Signed)
I signed this and placed it back on your desk

## 2019-07-01 ENCOUNTER — Other Ambulatory Visit: Payer: Self-pay

## 2019-07-01 NOTE — Telephone Encounter (Signed)
Called and notified pt that Rx will be up front for pick up.

## 2019-07-02 ENCOUNTER — Ambulatory Visit (INDEPENDENT_AMBULATORY_CARE_PROVIDER_SITE_OTHER): Payer: Medicare Other | Admitting: Family Medicine

## 2019-07-02 ENCOUNTER — Encounter: Payer: Self-pay | Admitting: Family Medicine

## 2019-07-02 VITALS — BP 136/86 | HR 80 | Temp 97.6°F | Ht 61.25 in | Wt 175.2 lb

## 2019-07-02 DIAGNOSIS — D692 Other nonthrombocytopenic purpura: Secondary | ICD-10-CM

## 2019-07-02 DIAGNOSIS — E669 Obesity, unspecified: Secondary | ICD-10-CM

## 2019-07-02 DIAGNOSIS — R413 Other amnesia: Secondary | ICD-10-CM | POA: Diagnosis not present

## 2019-07-02 DIAGNOSIS — G8929 Other chronic pain: Secondary | ICD-10-CM

## 2019-07-02 DIAGNOSIS — E785 Hyperlipidemia, unspecified: Secondary | ICD-10-CM

## 2019-07-02 DIAGNOSIS — Z0000 Encounter for general adult medical examination without abnormal findings: Secondary | ICD-10-CM

## 2019-07-02 DIAGNOSIS — I10 Essential (primary) hypertension: Secondary | ICD-10-CM | POA: Diagnosis not present

## 2019-07-02 DIAGNOSIS — M545 Low back pain: Secondary | ICD-10-CM

## 2019-07-02 DIAGNOSIS — Z9181 History of falling: Secondary | ICD-10-CM

## 2019-07-02 LAB — CBC WITH DIFFERENTIAL/PLATELET
Basophils Absolute: 0 10*3/uL (ref 0.0–0.1)
Basophils Relative: 1 % (ref 0.0–3.0)
Eosinophils Absolute: 0 10*3/uL (ref 0.0–0.7)
Eosinophils Relative: 1.3 % (ref 0.0–5.0)
HCT: 41.7 % (ref 36.0–46.0)
Hemoglobin: 14.1 g/dL (ref 12.0–15.0)
Lymphocytes Relative: 18.7 % (ref 12.0–46.0)
Lymphs Abs: 0.7 10*3/uL (ref 0.7–4.0)
MCHC: 33.9 g/dL (ref 30.0–36.0)
MCV: 97.1 fl (ref 78.0–100.0)
Monocytes Absolute: 0.4 10*3/uL (ref 0.1–1.0)
Monocytes Relative: 10.3 % (ref 3.0–12.0)
Neutro Abs: 2.5 10*3/uL (ref 1.4–7.7)
Neutrophils Relative %: 68.7 % (ref 43.0–77.0)
Platelets: 188 10*3/uL (ref 150.0–400.0)
RBC: 4.29 Mil/uL (ref 3.87–5.11)
RDW: 13.7 % (ref 11.5–15.5)
WBC: 3.7 10*3/uL — ABNORMAL LOW (ref 4.0–10.5)

## 2019-07-02 LAB — LIPID PANEL
Cholesterol: 275 mg/dL — ABNORMAL HIGH (ref 0–200)
HDL: 118.6 mg/dL (ref >39.00)
LDL Cholesterol: 147 mg/dL — ABNORMAL HIGH (ref 0–99)
NonHDL: 156.79
Total CHOL/HDL Ratio: 2
Triglycerides: 50 mg/dL (ref 0.0–149.0)
VLDL: 10 mg/dL (ref 0.0–40.0)

## 2019-07-02 LAB — COMPREHENSIVE METABOLIC PANEL
ALT: 13 U/L (ref 0–35)
AST: 19 U/L (ref 0–37)
Albumin: 4.4 g/dL (ref 3.5–5.2)
Alkaline Phosphatase: 55 U/L (ref 39–117)
BUN: 23 mg/dL (ref 6–23)
CO2: 33 mEq/L — ABNORMAL HIGH (ref 19–32)
Calcium: 9.9 mg/dL (ref 8.4–10.5)
Chloride: 97 mEq/L (ref 96–112)
Creatinine, Ser: 0.76 mg/dL (ref 0.40–1.20)
GFR: 72.15 mL/min (ref >60.00)
Glucose, Bld: 79 mg/dL (ref 70–99)
Potassium: 4.5 mEq/L (ref 3.5–5.1)
Sodium: 135 mEq/L (ref 135–145)
Total Bilirubin: 0.7 mg/dL (ref 0.2–1.2)
Total Protein: 6.4 g/dL (ref 6.0–8.3)

## 2019-07-02 LAB — TSH: TSH: 2.13 u[IU]/mL (ref 0.35–4.50)

## 2019-07-02 LAB — VITAMIN B12: Vitamin B-12: 341 pg/mL (ref 211–911)

## 2019-07-02 MED ORDER — HYDROCHLOROTHIAZIDE 25 MG PO TABS: 25.0000 mg | ORAL_TABLET | Freq: Every day | ORAL | 3 refills | Status: DC

## 2019-07-02 MED ORDER — TETANUS-DIPHTH-ACELL PERTUSSIS 5-2.5-18.5 LF-MCG/0.5 IM SUSP: 0.5000 mL | Freq: Once | INTRAMUSCULAR | 0 refills | Status: AC

## 2019-07-02 MED ORDER — ZOSTER VAC RECOMB ADJUVANTED 50 MCG/0.5ML IM SUSR: 0.5000 mL | Freq: Once | INTRAMUSCULAR | 0 refills | Status: AC

## 2019-07-02 NOTE — Patient Instructions (Addendum)
Health Maintenance Due  Topic Date Due  . TETANUS/TDAP and SHingrix- will give script to get at pharmacy if you choose 06/24/1950   Recommended follow up: Return in about 6 months (around 12/31/2019) for f/u HTN.      Lori Ramirez , Thank you for taking time to come for your Medicare Wellness Visit. I appreciate your ongoing commitment to your health goals. Please review the following plan we discussed and let me know if I can assist you in the future.   These are the goals we discussed: 1.  Use walker at all times to help avoid falls 2. Consider getting updated eye exam 3. Consider doing chair exercises- use sheets you have available at home   This is a list of the screening recommended for you and due dates:  Health Maintenance  Topic Date Due  . Tetanus Vaccine  06/24/1950  . DEXA scan (bone density measurement)  12/31/2024*  . Pneumonia vaccines (2 of 2 - PCV13) 12/31/2024*  . Flu Shot  07/12/2026*  *Topic was postponed. The date shown is not the original due date.   Please stop by lab before you go If you do not have mychart- we will call you about results within 5 business days of [place] receiving them.  If you have mychart- we will send your results within 3 business days of [place] receiving them.  If abnormal or we want to clarify a result, we will call or mychart you to make sure you receive the message.  If you have questions or concerns or don't hear within 5-7 days, please send [place] a message or call us.

## 2019-07-02 NOTE — Assessment & Plan Note (Signed)
May have some MCI 07/02/2019 MMSE 28/30

## 2019-07-02 NOTE — Progress Notes (Signed)
Phone: [phone]   Subjective:  Patient presents today for their annual physical. Chief complaint-noted.   See problem oriented charting- Review of Systems  Constitutional: Negative.   HENT: Negative.   Eyes: Negative.   Respiratory: Negative.   Cardiovascular: Negative.   Gastrointestinal: Negative.   Genitourinary: Negative.   Musculoskeletal: Positive for back pain.  Skin: Negative.   Neurological: Positive for weakness.  Endo/Heme/Allergies: Negative.   Psychiatric/Behavioral: Negative.     The following were reviewed and entered/updated in epic: Past Medical History:  Diagnosis Date  . Arthritis   . Essential hypertension 05/07/2015  . Obesity (BMI 30-39.9) 05/07/2015  . Osteoarthritis of left hip    Patient Active Problem List   Diagnosis Date Noted  . Hyperlipidemia 07/12/2016    Priority: Medium  . Essential hypertension 05/07/2015    Priority: Medium  . Obesity (BMI 30-39.9) 05/07/2015    Priority: Medium  . Low back pain 05/09/2017    Priority: Low  . Senile purpura (HCC) 01/17/2017    Priority: Low  . S/P left THA, AA 03/19/2013    Priority: Low  . Nonspecific pain in the lumbar region 06/04/2008    Priority: Low  . Risk for falls 07/02/2019  . Pain of both hip joints 06/14/2018   Past Surgical History:  Procedure Laterality Date  . ABDOMINAL HYSTERECTOMY  1977  . TONSILLECTOMY AND ADENOIDECTOMY  1939  . TOTAL HIP ARTHROPLASTY Left 03/19/2013   Procedure: LEFT TOTAL HIP ARTHROPLASTY ANTERIOR APPROACH;  Surgeon: [name], MD;  Location: [facility];  Service: Orthopedics;  Laterality: Left;    Family History  Problem Relation Age of Onset  . Other Mother        infection of unknown site led to death- 22s  . Other Father        lived to 37  . Healthy Brother     Medications- reviewed and updated Current Outpatient Medications  Medication Sig Dispense Refill  . amLODipine (NORVASC) 2.5 MG tablet Take 1 tablet (2.5 mg total) by mouth  daily. 30 tablet 5  . calcium gluconate 500 MG tablet Take 500 mg by mouth daily.    . clindamycin (CLEOCIN) 300 MG capsule TAKE 3 CAPSULES BY MOUTH 1 HOUR PRIOR TO DENTAL PROCEDURE    . hydrochlorothiazide (HYDRODIURIL) 25 MG tablet Take 1 tablet (25 mg total) by mouth daily. 90 tablet 3  . Tdap (BOOSTRIX) 5-2.5-18.5 LF-MCG/0.5 injection Inject 0.5 mLs into the muscle once for 1 dose. 0.5 mL 0  . Zoster Vaccine Adjuvanted [facility]) injection Inject 0.5 mLs into the muscle once for 1 dose. 0.5 mL 0   No current facility-administered medications for this visit.     Allergies-reviewed and updated Allergies  Allergen Reactions  . Amoxicillin   . Cortisone Acetate     REACTION: rash  . Iodine     REACTION: rash  . Prednisone     REACTION: rash  . Triamcinolone Acetonide     REACTION: rash    Social History   Social History Narrative   Married 1955- lives with husband who is Dr. Durene Cal ptaient. . 2 daughters. 2 grandsons. 2 greatgrandchildren in 2020.       Retired, was a Tree surgeon Situation: lives with husband      Spiritual Beliefs: protestant      Hobbies: golfing, cruises (outside of covid 19)   Objective  Objective:  BP 136/86   Pulse 80   Temp 97.6 F (36.4 C) (  Temporal)   Ht 5' 1.25" (1.556 m)   Wt 175 lb 3.2 oz (79.5 kg)   SpO2 95%   BMI 32.83 kg/m  Gen: NAD, resting comfortably HEENT: Mask not removed due to covid 19. TM normal. Bridge of nose normal. Eyelids normal.  Neck: no thyromegaly or cervical lymphadenopathy  CV: RRR no murmurs rubs or gallops Lungs: CTAB no crackles, wheeze, rhonchi Abdomen: soft/nontender/nondistended/normal bowel sounds. No rebound or guarding.  Ext: no edema Skin: warm, dry Neuro: grossly normal, moves all extremities, PERRLA   Assessment and Plan   85 y.o. female presenting for annual physical.  Health Maintenance counseling: 1. Anticipatory guidance: Patient counseled regarding regular dental exams q6  months, eye exams last one was around 3 yrs ago,  avoiding smoking and second hand smoke , limiting alcohol to 1 beverage per day- sometimes has 2 glasses encouraged to keep at 1  2. Risk factor reduction:  Advised patient of need for regular exercise and diet rich and fruits and vegetables to reduce risk of heart attack and stroke. Obesity noted. Exercise- unable to do because of balance- discussed chair exercises. Diet-eats a healthy diet. . Cutting wine could help. Tries to avoid fried foods. Recommended healthy mild weight loss Wt Readings from Last 3 Encounters:  07/02/19 175 lb 3.2 oz (79.5 kg)  06/17/19 174 lb (78.9 kg)  12/13/18 177 lb (80.3 kg)  3. Immunizations/screenings/ancillary studies- discussed Tdap and shingrix at pharmacy - they will consider. Declines flu shot.  Immunization History  Administered Date(s) Administered  . Pneumococcal Polysaccharide-23 06/04/2008  4. Cervical cancer screening-  past age based screening. Hysterectomy for benign reasons- polyps and bleeding.  5. Breast cancer screening-  Past age based screening requirements.  mammogram no longer gets  6. Colon cancer screening - past aged based screening. Never had abnormal. No rectal bleeding 7. Skin cancer screening- followed by dermatology in the past- Dr. Ubaldo Glassing as needed. advised regular sunscreen use. Denies worrisome, changing, or new skin lesions.  8. Birth control/STD check- postmenopausal/monogomous 9. Osteoporosis screening at 74-  has declined in the past- offered again today especially with fall risk - wants to push out to 6-12 months due to covid 19 -Never smoker  Status of chronic or acute concerns   Fall-last visit- we tried to get a stair chair lift set up, encouraged consistent walker use, and opted for raised toilet seat. Since that time  She has been able to get chair lift for stairs as well as raised toilet seat- no falls since last visit  Hyperlipidemia- we have opted not to treat for  primary prevention. Will update labs and focus on healthy eating and exercise as able- chair exercises due to fall risk.   Essential hypertension- compliant with amlodipine 2.5 mg started last visiit in addition to baseline hctz 25 mg (had been taking 2 tablets). Her blood pressure was elevated in office but home readings have been 130/70 each time. Husband checks every day at 10:00. She has had her mediations today at 7:30 am.  - on repeat today controlled. Looks good- continue current meds  Chronic bilateral low back pain without sciatica- still bothering her. Issues for years- MRI 2010 with some arthritic changes and has seen Dr. Alvan Dame and had x-rays. Patient has done PT with no improvement.  Senile purpura ([place])- easy bruising/bleeding noted. Avoid trauma - more minor than her husbands  Memory concern- husband  Has noticed forgetting ingredients for dishes she has used for years. MMSE 28/30 today. Will  get b12 and tsh. Low risk for syphilis or HIV and family opted to defer.   Recommended follow up: Return in about 6 months (around 12/31/2019) for follow up- or sooner if needed.  Chief Complaint  Patient presents with  . Annual Exam   Lab/Order associations: Not fasting   ICD-10-CM   1. Preventative health care  Z00.00 CBC with Differential    Comprehensive metabolic panel    Lipid panel    Vitamin B12    TSH  2. Hyperlipidemia, unspecified hyperlipidemia type  E78.5 CBC with Differential    Comprehensive metabolic panel    Lipid panel    TSH  3. Essential hypertension  I10   4. Chronic bilateral low back pain without sciatica  M54.5    G89.29   5. Senile purpura (HCC)  D69.2   6. Obesity (BMI 30-39.9)  E66.9   7. Risk for falls  Z91.81   8. Memory loss  R41.3 Vitamin B12    TSH    Meds ordered this encounter  Medications  . Tdap (BOOSTRIX) 5-2.5-18.5 LF-MCG/0.5 injection    Sig: Inject 0.5 mLs into the muscle once for 1 dose.    Dispense:  0.5 mL    Refill:  0  . Zoster  Vaccine Adjuvanted [facility](SHINGRIX) injection    Sig: Inject 0.5 mLs into the muscle once for 1 dose.    Dispense:  0.5 mL    Refill:  0  . hydrochlorothiazide (HYDRODIURIL) 25 MG tablet    Sig: Take 1 tablet (25 mg total) by mouth daily.    Dispense:  90 tablet    Refill:  3    Return precautions advised.  Tana ConchStephen Lakechia Nay, MD

## 2019-07-02 NOTE — Progress Notes (Signed)
Phone: [phone]    Subjective:   Patient presents today for their annual wellness visit.    Preventive Screening-Counseling & Management  Smoking Status: Never Smoker Second Hand Smoking status: No smokers in home Alcohol intake: at least 7 per week. Sometimes has 2 glasses of wine each night- advised limit to 1  Risk Factors Regular exercise: no- encouraged regular exercise Diet: slight weight loss- counseling provided  Wt Readings from Last 3 Encounters:  07/02/19 175 lb 3.2 oz (79.5 kg)  06/17/19 174 lb (78.9 kg)  12/13/18 177 lb (80.3 kg)    Mobility assessment: high risk for falls- strongly encouraged walker at all times Fall Risk: yes  Fall Risk  07/02/2019 06/17/2019 12/13/2018 01/18/2018 01/17/2017  Falls in the past year? 1 1 0 No Yes  Number falls in past yr: 1 - 0 - 1  Injury with Fall? 1 1 0 - No  Risk for fall due to : History of fall(s) - - - Impaired mobility  Follow up Education provided - - - Education provided;Falls prevention discussed  Comment in home has walker and lift - - - -  Opioid use history:  no long term opioids use  Cardiac risk factors:  advanced age (older than 65 for men, 46 for women)  untreated Hyperlipidemia  Treated Hypertension  No diabetes.  Lab Results  Component Value Date   HGBA1C 5.5 01/18/2018  Family History: no CAD history   Depression Screen None. PHQ2 0 - team will enter  Activities of Daily Living Independent ADLs (toileting, bathing, dressing, transferring, eating)  For IADLs (shopping- shops with husband, housekeeping- yes with husband, managing own medications- yes, and handling finances-does this together with husband)  Hearing Difficulties: -patient declines  Cognitive Testing             Husband reports some concern  MMSE today: 28/30- see physical note  List the Names of Other Physician/Practitioners you currently use: Patient Care Team: Shelva Majestic, MD as PCP - General (Family Medicine) Venancio Poisson, MD as Consulting Physician (Dermatology) Durene Romans, MD as Consulting Physician (Orthopedic Surgery)  Immunization History  Administered Date(s) Administered  . Pneumococcal Polysaccharide-23 06/04/2008   Required Immunizations needed today - discussed Tdap and shingrix at pharmacy. Has declined prevnar 13 (no longer indicated) and declines flu  Health Maintenance  Topic Date Due  . Tetanus Vaccine  06/24/1950  . DEXA scan (bone density measurement)  12/31/2024*  . Pneumonia vaccines (2 of 2 - PCV13) 12/31/2024*  . Flu Shot  07/12/2026*  *Topic was postponed. The date shown is not the original due date.    Screening tests-   1. Colon cancer screening- past age based screening, denies abnormal history 2. Lung Cancer screening- not a candidate 3. Skin cancer screening-  Dr. Nicholas Lose of dermatology at least yearly 4. Cervical cancer screening-  past age based screening, denies abnormal history 5. Breast cancer screening-  past age based screening   ROS- No pertinent positives discovered in course of AWV  The following were reviewed and entered/updated in epic: Past Medical History:  Diagnosis Date  . Arthritis   . Essential hypertension 05/07/2015  . Obesity (BMI 30-39.9) 05/07/2015  . Osteoarthritis of left hip    Patient Active Problem List   Diagnosis Date Noted  . Hyperlipidemia 07/12/2016    Priority: Medium  . Essential hypertension 05/07/2015    Priority: Medium  . Obesity (BMI 30-39.9) 05/07/2015    Priority: Medium  . Low back pain 05/09/2017  Priority: Low  . Senile purpura (HCC) 01/17/2017    Priority: Low  . S/P left THA, AA 03/19/2013    Priority: Low  . Nonspecific pain in the lumbar region 06/04/2008    Priority: Low  . Risk for falls 07/02/2019  . Pain of both hip joints 06/14/2018   Past Surgical History:  Procedure Laterality Date  . ABDOMINAL HYSTERECTOMY  1977  . TONSILLECTOMY AND ADENOIDECTOMY  1939  . TOTAL HIP ARTHROPLASTY Left  03/19/2013   Procedure: LEFT TOTAL HIP ARTHROPLASTY ANTERIOR APPROACH;  Surgeon: Shelda PalMatthew D Olin, MD;  Location: [facility];  Service: Orthopedics;  Laterality: Left;    Family History  Problem Relation Age of Onset  . Other Mother        infection of unknown site led to death- 5950s  . Other Father        lived to [id number]  . Healthy Brother     Medications- reviewed and updated Current Outpatient Medications  Medication Sig Dispense Refill  . amLODipine (NORVASC) 2.5 MG tablet Take 1 tablet (2.5 mg total) by mouth daily. 30 tablet 5  . calcium gluconate 500 MG tablet Take 500 mg by mouth daily.    . clindamycin (CLEOCIN) 300 MG capsule TAKE 3 CAPSULES BY MOUTH 1 HOUR PRIOR TO DENTAL PROCEDURE    . hydrochlorothiazide (HYDRODIURIL) 25 MG tablet Take 1 tablet (25 mg total) by mouth daily. 90 tablet 3  . Tdap (BOOSTRIX) 5-2.5-18.5 LF-MCG/0.5 injection Inject 0.5 mLs into the muscle once for 1 dose. 0.5 mL 0  . Zoster Vaccine Adjuvanted [facility](SHINGRIX) injection Inject 0.5 mLs into the muscle once for 1 dose. 0.5 mL 0   No current facility-administered medications for this visit.     Allergies-reviewed and updated Allergies  Allergen Reactions  . Amoxicillin   . Cortisone Acetate     REACTION: rash  . Iodine     REACTION: rash  . Prednisone     REACTION: rash  . Triamcinolone Acetonide     REACTION: rash    Social History   Socioeconomic History  . Marital status: Married    Spouse name: Not on file  . Number of children: Not on file  . Years of education: Not on file  . Highest education level: Not on file  Occupational History  . Not on file  Social Needs  . Financial resource strain: Not on file  . Food insecurity    Worry: Not on file    Inability: Not on file  . Transportation needs    Medical: Not on file    Non-medical: Not on file  Tobacco Use  . Smoking status: Never Smoker  . Smokeless tobacco: Never Used  Substance and Sexual Activity  . Alcohol use: Yes     Alcohol/week: 14.0 standard drinks    Types: 14 Glasses of wine per week    Comment: 1-2 glasses of wine daily   . Drug use: No  . Sexual activity: Not on file  Lifestyle  . Physical activity    Days per week: Not on file    Minutes per session: Not on file  . Stress: Not on file  Relationships  . Social Musicianconnections    Talks on phone: Not on file    Gets together: Not on file    Attends religious service: Not on file    Active member of club or organization: Not on file    Attends meetings of clubs or organizations: Not on file  Relationship status: Not on file  Other Topics Concern  . Not on file  Social History Narrative   Married 1955- lives with husband who is Dr. Yong Channel ptaient. . 2 daughters. 2 grandsons. 2 greatgrandchildren in 2020.       Retired, was a Geologist, engineering Situation: lives with husband      Spiritual Beliefs: protestant      Hobbies: golfing, cruises (outside of covid 65)      Objective:  BP 136/86   Pulse 80   Temp 97.6 F (36.4 C) (Temporal)   Ht 5' 1.25" (1.556 m)   Wt 175 lb 3.2 oz (79.5 kg)   SpO2 95%   BMI 32.83 kg/m  Gen: NAD, resting comfortably HEENT: Mucous membranes are moist. Oropharynx normal Neck: no thyromegaly CV: RRR no murmurs rubs or gallops Lungs: CTAB no crackles, wheeze, rhonchi Abdomen: soft/nontender/nondistended/normal bowel sounds. No rebound or guarding.  Ext: no edema Skin: warm, dry Neuro: grossly normal, moves all extremities, PERRLA   Assessment/Plan:  AWV completed- discussed recommended screenings and documented any personalized health advice and referrals for preventive counseling. See AVS as well which was given to patient.   Status of chronic or acute concerns  See CPE   Recommended follow up: 1 year awv   Lab/Order associations:   ICD-10-CM   1. Preventative health care  Z00.00    G89.29     Return precautions advised.  Garret Reddish, MD

## 2019-10-16 ENCOUNTER — Encounter: Payer: Self-pay | Admitting: Family Medicine

## 2019-12-18 ENCOUNTER — Other Ambulatory Visit: Payer: Self-pay | Admitting: Family Medicine

## 2019-12-25 NOTE — Patient Instructions (Addendum)
Health Maintenance Due  Topic Date Due  . TETANUS/TDAP will get at pharmacy if decides to get  Never done   Please stop by lab before you go If you have mychart- we will send your results within 3 business days of [place] receiving them.  If you do not have mychart- we will call you about results within 5 business days of [place] receiving them.     Blood pressure: Your blood pressures are good at home and in office. Stay on all medications.  Cholesterol: Your cholesterol is hight but we do not want to put you on medications at this time. Would try to start doing any exercises that you feel you are comfortable doing. Any increased activity will be helpful for you. Try to set a goal at 10 to 15 minutes of chair exercises a day. We have given some examples below.    If you change your mind on ordering the bone density let our office know we can order at any time. This is the test that will help [place] determine how strong your bones are.    If you have any questions or concerns before your next scheduled appointment please give our office a call.     Exercises To Do While Sitting  Exercises that you do while sitting (chair exercises) can give you many of the same benefits as full exercise. Benefits include strengthening your heart, burning calories, and keeping muscles and joints healthy. Exercise can also improve your mood and help with depression and anxiety. You may benefit from chair exercises if you are unable to do standing exercises because of:  Diabetic foot pain.  Obesity.  Illness.  Arthritis.  Recovery from surgery or injury.  Breathing problems.  Balance problems.  Another type of disability. Before starting chair exercises, check with your health care provider or a physical therapist to find out how much exercise you can tolerate and which exercises are safe for you. If your health care provider approves:  Start out slowly and build up over time. Aim to work up to about 10-20  minutes for each exercise session.  Make exercise part of your daily routine.  Drink water when you exercise. Do not wait until you are thirsty. Drink every 10-15 minutes.  Stop exercising right away if you have pain, nausea, shortness of breath, or dizziness.  If you are exercising in a wheelchair, make sure to lock the wheels.  Ask your health care provider whether you can do tai chi or yoga. Many positions in these mind-body exercises can be modified to do while seated. Warm-up Before starting other exercises: 1. Sit up as straight as you can. Have your knees bent at 90 degrees, which is the shape of the capital letter "L." Keep your feet flat on the floor. 2. Sit at the front edge of your chair, if you can. 3. Pull in (tighten) the muscles in your abdomen and stretch your spine and neck as straight as you can. Hold this position for a few minutes. 4. Breathe in and out evenly. Try to concentrate on your breathing, and relax your mind. Stretching Exercise A: Arm stretch 1. Hold your arms out straight in front of your body. 2. Bend your hands at the wrist with your fingers pointing up, as if signaling someone to stop. Notice the slight tension in your forearms as you hold the position. 3. Keeping your arms out and your hands bent, rotate your hands outward as far as you can and  hold this stretch. Aim to have your thumbs pointing up and your pinkie fingers pointing down. Slowly repeat arm stretches for one minute as tolerated. Exercise B: Leg stretch 1. If you can move your legs, try to "draw" letters on the floor with the toes of your foot. Write your name with one foot. 2. Write your name with the toes of your other foot. Slowly repeat the movements for one minute as tolerated. Exercise C: Reach for the sky 1. Reach your hands as far over your head as you can to stretch your spine. 2. Move your hands and arms as if you are climbing a rope. Slowly repeat the movements for one minute  as tolerated. Range of motion exercises Exercise A: Shoulder roll 1. Let your arms hang loosely at your sides. 2. Lift just your shoulders up toward your ears, then let them relax back down. 3. When your shoulders feel loose, rotate your shoulders in backward and forward circles. Do shoulder rolls slowly for one minute as tolerated. Exercise B: March in place 1. As if you are marching, pump your arms and lift your legs up and down. Lift your knees as high as you can. ? If you are unable to lift your knees, just pump your arms and move your ankles and feet up and down. March in place for one minute as tolerated. Exercise C: Seated jumping jacks 1. Let your arms hang down straight. 2. Keeping your arms straight, lift them up over your head. Aim to point your fingers to the ceiling. 3. While you lift your arms, straighten your legs and slide your heels along the floor to your sides, as wide as you can. 4. As you bring your arms back down to your sides, slide your legs back together. ? If you are unable to use your legs, just move your arms. Slowly repeat seated jumping jacks for one minute as tolerated. Strengthening exercises Exercise A: Shoulder squeeze 1. Hold your arms straight out from your body to your sides, with your elbows bent and your fists pointed at the ceiling. 2. Keeping your arms in the bent position, move them forward so your elbows and forearms meet in front of your face. 3. Open your arms back out as wide as you can with your elbows still bent, until you feel your shoulder blades squeezing together. Hold for 5 seconds. Slowly repeat the movements forward and backward for one minute as tolerated. Contact a health care provider if you:  Had to stop exercising due to any of the following: ? Pain. ? Nausea. ? Shortness of breath. ? Dizziness. ? Fatigue.  Have significant pain or soreness after exercising. Get help right away if you have:  Chest pain.  Difficulty  breathing. These symptoms may represent a serious problem that is an emergency. Do not wait to see if the symptoms will go away. Get medical help right away. Call your local emergency services (911 in the [place] Do not drive yourself to the hospital. This information is not intended to replace advice given to you by your health care provider. Make sure you discuss any questions you have with your health care provider. Document Revised: 11/01/2018 Document Reviewed: 05/24/2017 Elsevier Patient Education  2020 [employer].

## 2019-12-25 NOTE — Progress Notes (Signed)
Phone [phone] In person visit   Subjective:   Lori Ramirez is a 86 y.o. year old very pleasant female patient who presents for/with See problem oriented charting Chief Complaint  Patient presents with  . Hypertension  . Hyperlipidemia   This visit occurred during the SARS-CoV-2 public health emergency.  Safety protocols were in place, including screening questions prior to the visit, additional usage of staff PPE, and extensive cleaning of exam room while observing appropriate contact time as indicated for disinfecting solutions.   Past Medical History-  Patient Active Problem List   Diagnosis Date Noted  . Hyperlipidemia 07/12/2016    Priority: Medium  . Essential hypertension 05/07/2015    Priority: Medium  . Obesity (BMI 30-39.9) 05/07/2015    Priority: Medium  . Low back pain 05/09/2017    Priority: Low  . Senile purpura (HCC) 01/17/2017    Priority: Low  . S/P left THA, AA 03/19/2013    Priority: Low  . Nonspecific pain in the lumbar region 06/04/2008    Priority: Low  . Risk for falls 07/02/2019  . Memory loss 07/02/2019  . Pain of both hip joints 06/14/2018    Medications- reviewed and updated Current Outpatient Medications  Medication Sig Dispense Refill  . amLODipine (NORVASC) 2.5 MG tablet TAKE 1 TABLET BY MOUTH EVERY DAY 90 tablet 1  . calcium gluconate 500 MG tablet Take 500 mg by mouth daily.    . clindamycin (CLEOCIN) 300 MG capsule TAKE 3 CAPSULES BY MOUTH 1 HOUR PRIOR TO DENTAL PROCEDURE    . hydrochlorothiazide (HYDRODIURIL) 25 MG tablet Take 1 tablet (25 mg total) by mouth daily. 90 tablet 3   No current facility-administered medications for this visit.     Objective:  BP 132/78   Pulse 88   Temp (!) 97.3 F (36.3 C) (Temporal)   Ht 5' 1.25" (1.556 m)   Wt 172 lb (78 kg)   SpO2 96%   BMI 32.23 kg/m  Gen: NAD, resting comfortably CV: RRR no murmurs rubs or gallops Lungs: CTAB no crackles, wheeze, rhonchi Ext: trace edema Skin: warm,  dry    Assessment and Plan   #hypertension S: medication: HCTZ 25Mg , Amlodipine 2.5Mg  Home readings #s:  Has been checking once a month at home readings have been on average 130/80.  BP Readings from Last 3 Encounters:  12/31/19 132/78  07/02/19 136/86  06/17/19 (!) 142/100  A/P: Stable. Continue current medications.   #hyperlipidemia S: Medication: Not currently taking medications.  Lab Results  Component Value Date   CHOL 275 (H) 07/02/2019   HDL 118.60 07/02/2019   LDLCALC 147 (H) 07/02/2019   LDLDIRECT 120.5 12/24/2012   TRIG 50.0 07/02/2019   CHOLHDL 2 07/02/2019   A/P: poor control but above age 10 typically do not start statin for primary prevention  # Obesity  S:trying to eat reasonably healthy. Not exercising  Wt Readings from Last 3 Encounters:  12/31/19 172 lb (78 kg)  07/02/19 175 lb 3.2 oz (79.5 kg)  06/17/19 174 lb (78.9 kg)  A/P: slight improvement- encouraged her to add exercise.    #Leukopenia-stable mild leukopenia over the years and other cell lines reassuring.  Update CBC with blood work today  #Health maintenance-no bone density on file-offered today and she declines for now- will recheck next visit   #senile purpura- stable. Continue to monitor. Check CBC.   Recommended follow up: Return in about 6 months (around 07/01/2020) for physical or sooner if needed. can also do  AWV same day if 40 minute slot. . No future appointments. Lab/Order associations:   ICD-10-CM   1. Essential hypertension  I10 CBC with Differential/Platelet    Basic metabolic panel  2. Hyperlipidemia, unspecified hyperlipidemia type  E78.5   3. Obesity (BMI 30-39.9)  E66.9   4. Leukopenia, unspecified type  D72.819 CBC with Differential/Platelet    Return precautions advised.  Garret Reddish, MD

## 2019-12-31 ENCOUNTER — Other Ambulatory Visit: Payer: Self-pay

## 2019-12-31 ENCOUNTER — Ambulatory Visit: Payer: Medicare PPO | Admitting: Family Medicine

## 2019-12-31 ENCOUNTER — Encounter: Payer: Self-pay | Admitting: Family Medicine

## 2019-12-31 VITALS — BP 132/78 | HR 88 | Temp 97.3°F | Ht 61.25 in | Wt 172.0 lb

## 2019-12-31 DIAGNOSIS — E669 Obesity, unspecified: Secondary | ICD-10-CM | POA: Diagnosis not present

## 2019-12-31 DIAGNOSIS — D72819 Decreased white blood cell count, unspecified: Secondary | ICD-10-CM | POA: Diagnosis not present

## 2019-12-31 DIAGNOSIS — I10 Essential (primary) hypertension: Secondary | ICD-10-CM | POA: Diagnosis not present

## 2019-12-31 DIAGNOSIS — D692 Other nonthrombocytopenic purpura: Secondary | ICD-10-CM

## 2019-12-31 DIAGNOSIS — E785 Hyperlipidemia, unspecified: Secondary | ICD-10-CM

## 2019-12-31 LAB — BASIC METABOLIC PANEL
BUN: 26 mg/dL — ABNORMAL HIGH (ref 6–23)
CO2: 32 mEq/L (ref 19–32)
Calcium: 9.7 mg/dL (ref 8.4–10.5)
Chloride: 98 mEq/L (ref 96–112)
Creatinine, Ser: 0.79 mg/dL (ref 0.40–1.20)
GFR: 68.92 mL/min (ref >60.00)
Glucose, Bld: 93 mg/dL (ref 70–99)
Potassium: 4.2 mEq/L (ref 3.5–5.1)
Sodium: 136 mEq/L (ref 135–145)

## 2019-12-31 LAB — CBC WITH DIFFERENTIAL/PLATELET
Basophils Absolute: 0 10*3/uL (ref 0.0–0.1)
Basophils Relative: 0.8 % (ref 0.0–3.0)
Eosinophils Absolute: 0.1 10*3/uL (ref 0.0–0.7)
Eosinophils Relative: 1.7 % (ref 0.0–5.0)
HCT: 38.9 % (ref 36.0–46.0)
Hemoglobin: 13.7 g/dL (ref 12.0–15.0)
Lymphocytes Relative: 22.3 % (ref 12.0–46.0)
Lymphs Abs: 0.7 10*3/uL (ref 0.7–4.0)
MCHC: 35.1 g/dL (ref 30.0–36.0)
MCV: 95.9 fl (ref 78.0–100.0)
Monocytes Absolute: 0.3 10*3/uL (ref 0.1–1.0)
Monocytes Relative: 10.6 % (ref 3.0–12.0)
Neutro Abs: 2.1 10*3/uL (ref 1.4–7.7)
Neutrophils Relative %: 64.6 % (ref 43.0–77.0)
Platelets: 178 10*3/uL (ref 150.0–400.0)
RBC: 4.05 Mil/uL (ref 3.87–5.11)
RDW: 14.3 % (ref 11.5–15.5)
WBC: 3.3 10*3/uL — ABNORMAL LOW (ref 4.0–10.5)

## 2019-12-31 MED ORDER — HYDROCHLOROTHIAZIDE 25 MG PO TABS: 25.0000 mg | ORAL_TABLET | Freq: Every day | ORAL | 3 refills | Status: DC

## 2019-12-31 MED ORDER — AMLODIPINE BESYLATE 2.5 MG PO TABS: 2.5000 mg | ORAL_TABLET | Freq: Every day | ORAL | 3 refills | Status: DC

## 2020-07-01 NOTE — Progress Notes (Signed)
Phone [phone]   Subjective:  Patient presents today for their annual physical. Chief complaint-noted.   See problem oriented charting- ROS- full  review of systems was completed and negative except for: back pain  The following were reviewed and entered/updated in epic: Past Medical History:  Diagnosis Date  . Arthritis   . Essential hypertension 05/07/2015  . Obesity (BMI 30-39.9) 05/07/2015  . Osteoarthritis of left hip    Patient Active Problem List   Diagnosis Date Noted  . Hyperlipidemia 07/12/2016    Priority: Medium  . Essential hypertension 05/07/2015    Priority: Medium  . Obesity (BMI 30-39.9) 05/07/2015    Priority: Medium  . Low back pain 05/09/2017    Priority: Low  . Senile purpura (HCC) 01/17/2017    Priority: Low  . S/P left THA, AA 03/19/2013    Priority: Low  . Nonspecific pain in the lumbar region 06/04/2008    Priority: Low  . Risk for falls 07/02/2019  . Memory loss 07/02/2019  . Pain of both hip joints 06/14/2018   Past Surgical History:  Procedure Laterality Date  . ABDOMINAL HYSTERECTOMY  1977  . TONSILLECTOMY AND ADENOIDECTOMY  1939  . TOTAL HIP ARTHROPLASTY Left 03/19/2013   Procedure: LEFT TOTAL HIP ARTHROPLASTY ANTERIOR APPROACH;  Surgeon: [name], MD;  Location: [facility];  Service: Orthopedics;  Laterality: Left;    Family History  Problem Relation Age of Onset  . Other Mother        infection of unknown site led to death- 61s  . Other Father        lived to 37  . Healthy Brother     Medications- reviewed and updated Current Outpatient Medications  Medication Sig Dispense Refill  . amLODipine (NORVASC) 2.5 MG tablet Take 1 tablet (2.5 mg total) by mouth daily. 90 tablet 3  . calcium gluconate 500 MG tablet Take 500 mg by mouth daily.    . clindamycin (CLEOCIN) 300 MG capsule TAKE 3 CAPSULES BY MOUTH 1 HOUR PRIOR TO DENTAL PROCEDURE    . hydrochlorothiazide (HYDRODIURIL) 25 MG tablet Take 1 tablet (25 mg total) by  mouth daily. 90 tablet 3   No current facility-administered medications for this visit.    Allergies-reviewed and updated Allergies  Allergen Reactions  . Amoxicillin   . Cortisone Acetate     REACTION: rash  . Iodine     REACTION: rash  . Prednisone     REACTION: rash  . Triamcinolone Acetonide     REACTION: rash    Social History   Social History Narrative   Married 1955- lives with husband who is Dr. Durene Cal ptaient. . 2 daughters. 2 grandsons. 2 greatgrandchildren in 2020.       Retired, was a Tree surgeon Situation: lives with husband      Spiritual Beliefs: protestant      Hobbies: golfing, cruises (outside of covid 19)   Objective  Objective:  BP (!) 162/84   Pulse 78   Temp 98.3 F (36.8 C) (Temporal)   Ht 5\' 1"  (1.549 m)   Wt 174 lb 9.6 oz (79.2 kg)   SpO2 97%   BMI 32.99 kg/m  Gen: NAD, resting comfortably HEENT: Mucous membranes are moist. Oropharynx normal Neck: no thyromegaly CV: RRR no murmurs rubs or gallops Lungs: CTAB no crackles, wheeze, rhonchi Abdomen: soft/nontender/nondistended/normal bowel sounds. No rebound or guarding.  Ext: trace edema Skin: warm, dry Neuro: looks to husband for some answers, needs  assist strength wise to get onto table,  PERRLA   Assessment and Plan   86 y.o. female presenting for annual physical.  Health Maintenance counseling: 1. Anticipatory guidance: Patient counseled regarding regular dental exams -q3 months, eye exams - recommended updating exam,  avoiding smoking and second hand smoke , limiting alcohol to 1 beverage per day .   2. Risk factor reduction:  Advised patient of need for regular exercise and diet rich and fruits and vegetables to reduce risk of heart attack and stroke. Exercise-has reported balance issues as a barrier-have discussed chair exercises in the past and again today- also discussed PT. Diet-tries eat a reasonably healthy diet-weight down slightly from last year 1 lb. .  Wt  Readings from Last 3 Encounters:  07/02/20 174 lb 9.6 oz (79.2 kg)  12/31/19 172 lb (78 kg)  07/02/19 175 lb 3.2 oz (79.5 kg)  3. Immunizations/screenings/ancillary studies-declines flu shot.  Declines Shingrix  Immunization History  Administered Date(s) Administered  . PFIZER SARS-COV-2 Vaccination 08/19/2019, 09/09/2019, 05/04/2020  . Pneumococcal Polysaccharide-23 06/04/2008  4. Cervical cancer screening- past age for screening recommendations.  No vaginal bleeding.  Hysterectomy for benign reasons-polyps and bleeding related 5. Breast cancer screening-past age based screening recommendations.  No longer gets mammograms  6. Colon cancer screening - past age based screening recommendations.  Denies ever having abnormal colonoscopy.  No rectal bleeding  7. Skin cancer screening-sees Dr. Nicholas Lose as needed. advised regular sunscreen use. Denies worrisome, changing, or new skin lesions.  8. Birth control/STD check- postmenopausal and monogamous 9. Osteoporosis screening at 43- has declined in the past including last year due to COVID-19 pandemic.  Offered again today- declines again- stressed importance with fall risk -Never smoker  Status of chronic or acute concerns   # Falls- has had 4 falls in last 6 months. Last one feels like shoe got caught in carpet on dec 1 (not a loose rug). History of balance issues. Recommended PT and she declined today. Was not helpful years ago after hip replacement on left.   #hypertension S: medication: HCTZ 25Mg , Amlodipine 2.5Mg - took around 7  Or 7 30 this morning.   Home readings #s:  119/70 most recently BP Readings from Last 3 Encounters:  07/02/20 (!) 162/84  12/31/19 132/78  07/02/19 136/86   A/P: blood pressure slightly high today but just took meds this morning. I want her to monitor over the next week and update me by mychart- goal on home readings 135/85 or less- may tweak meds if higher than this ut most recent home value well below this at  119/70  #hyperlipidemia-statin not indicated for primary prevention S: Medication:None Lab Results  Component Value Date   CHOL 275 (H) 07/02/2019   HDL 118.60 07/02/2019   LDLCALC 147 (H) 07/02/2019   LDLDIRECT 120.5 12/24/2012   TRIG 50.0 07/02/2019   CHOLHDL 2 07/02/2019   A/P: Update lipids but unlikely to start statin for primary prevention at her age  #Leukopenia-stable mild leukopenia over the years and other cell lines reassuring.  Likely no intervention unless worsens update CBC with blood work today  Lab Results  Component Value Date   WBC 3.3 (L) 12/31/2019   HGB 13.7 12/31/2019   HCT 38.9 12/31/2019   MCV 95.9 12/31/2019   PLT 178.0 12/31/2019   #Mild memory changes-MMSE 28 out of 30 on July 02, 2019. Husband reports mild  Worsening. MMSE today of 26/30 (lost year, name of this place, 2 on delayed recall). Offered neuro  referral they would liek to hold off for now- reconsider next year MMSE - Mini Mental State Exam 07/02/2019 01/17/2017  Orientation to time 5 5  Orientation to Place 4 5  Registration 3 3  Attention/ Calculation 5 5  Recall 2 1  Language- name 2 objects 2 2  Language- repeat 1 1  Language- follow 3 step command 3 3  Language- read & follow direction 1 1  Write a sentence 1 1  Copy design 1 1  Total score 28 28   Will add in b12 with low normal level Lab Results  Component Value Date   VITAMINB12 341 07/02/2019   Lab Results  Component Value Date   TSH 2.13 07/02/2019   #Senile purpura-stable easy bruising.  Monitor CBC  Recommended follow up: Return in about 6 months (around 12/31/2020) for follow up- or sooner if needed.  Lab/Order associations: not fasting   ICD-10-CM   1. Essential hypertension  I10   2. Hyperlipidemia, unspecified hyperlipidemia type  E78.5   3. Memory loss  R41.3   4. Preventative health care  Z00.00     No orders of the defined types were placed in this encounter.   Return precautions advised.  Tana Conch, MD

## 2020-07-01 NOTE — Patient Instructions (Addendum)
Please stop by lab before you go If you have mychart- we will send your results within 3 business days of [place] receiving them.  If you do not have mychart- we will call you about results within 5 business days of [place] receiving them.  *please note we are currently using [facility] which has a longer processing time than [place] typically so labs may not come back as quickly as in the past *please also note that you will see labs on mychart as soon as they post. I will later go in and write notes on them- will say "notes from Dr. Durene Cal"  blood pressure slightly high today but just took meds this morning. I want her to monitor over the next week and update me by mychart- goal on home readings 135/85 or less- may tweak meds if higher than this ut most recent home value well below this at 119/70  Due to insurance changes-I am no longer confident that  I can do wellness visits and physicals on the same day.  Please schedule a wellness visit with our nurse specialist Inetta Fermo before you leave  Would recommend 1000 mcg b12 daily  Would update eye exam with eye doctor  Also would update your skin cancer screening with Dr. Nicholas Lose.   Please check with your pharmacy to see if they have the shingrix vaccine. If they do- please get this immunization and update [place] by phone call or mychart with dates you receive the vaccine  Recommended follow up: No follow-ups on file.

## 2020-07-02 ENCOUNTER — Encounter: Payer: Self-pay | Admitting: Family Medicine

## 2020-07-02 ENCOUNTER — Other Ambulatory Visit: Payer: Self-pay

## 2020-07-02 ENCOUNTER — Ambulatory Visit (INDEPENDENT_AMBULATORY_CARE_PROVIDER_SITE_OTHER): Payer: Medicare PPO | Admitting: Family Medicine

## 2020-07-02 VITALS — BP 162/84 | HR 78 | Temp 98.3°F | Ht 61.0 in | Wt 174.6 lb

## 2020-07-02 DIAGNOSIS — R413 Other amnesia: Secondary | ICD-10-CM | POA: Diagnosis not present

## 2020-07-02 DIAGNOSIS — Z0000 Encounter for general adult medical examination without abnormal findings: Secondary | ICD-10-CM

## 2020-07-02 DIAGNOSIS — E785 Hyperlipidemia, unspecified: Secondary | ICD-10-CM

## 2020-07-02 DIAGNOSIS — I10 Essential (primary) hypertension: Secondary | ICD-10-CM | POA: Diagnosis not present

## 2020-07-03 LAB — CBC WITH DIFFERENTIAL/PLATELET
Absolute Monocytes: 406 cells/uL (ref 200–950)
Basophils Absolute: 29 cells/uL (ref 0–200)
Basophils Relative: 0.7 %
Eosinophils Absolute: 57 cells/uL (ref 15–500)
Eosinophils Relative: 1.4 %
HCT: 39.5 % (ref 35.0–45.0)
Hemoglobin: 13.7 g/dL (ref 11.7–15.5)
Lymphs Abs: 644 cells/uL — ABNORMAL LOW (ref 850–3900)
MCH: 33.4 pg — ABNORMAL HIGH (ref 27.0–33.0)
MCHC: 34.7 g/dL (ref 32.0–36.0)
MCV: 96.3 fL (ref 80.0–100.0)
MPV: 9.6 fL (ref 7.5–12.5)
Monocytes Relative: 9.9 %
Neutro Abs: 2964 cells/uL (ref 1500–7800)
Neutrophils Relative %: 72.3 %
Platelets: 201 10*3/uL (ref 140–400)
RBC: 4.1 10*6/uL (ref 3.80–5.10)
RDW: 12.5 % (ref 11.0–15.0)
Total Lymphocyte: 15.7 %
WBC: 4.1 10*3/uL (ref 3.8–10.8)

## 2020-07-03 LAB — COMPLETE METABOLIC PANEL WITH GFR
AG Ratio: 2 (calc) (ref 1.0–2.5)
ALT: 14 U/L (ref 6–29)
AST: 20 U/L (ref 10–35)
Albumin: 4.3 g/dL (ref 3.6–5.1)
Alkaline phosphatase (APISO): 48 U/L (ref 37–153)
BUN: 25 mg/dL (ref 7–25)
CO2: 33 mmol/L — ABNORMAL HIGH (ref 20–32)
Calcium: 9.7 mg/dL (ref 8.6–10.4)
Chloride: 98 mmol/L (ref 98–110)
Creat: 0.86 mg/dL (ref 0.60–0.88)
GFR, Est African American: 70 mL/min/{1.73_m2} (ref >=60)
GFR, Est Non African American: 61 mL/min/{1.73_m2} (ref >=60)
Globulin: 2.1 g/dL (calc) (ref 1.9–3.7)
Glucose, Bld: 90 mg/dL (ref 65–99)
Potassium: 3.7 mmol/L (ref 3.5–5.3)
Sodium: 137 mmol/L (ref 135–146)
Total Bilirubin: 0.7 mg/dL (ref 0.2–1.2)
Total Protein: 6.4 g/dL (ref 6.1–8.1)

## 2020-07-03 LAB — LIPID PANEL
Cholesterol: 261 mg/dL — ABNORMAL HIGH (ref <200)
HDL: 119 mg/dL (ref >=50)
LDL Cholesterol (Calc): 127 mg/dL (calc) — ABNORMAL HIGH
Non-HDL Cholesterol (Calc): 142 mg/dL (calc) — ABNORMAL HIGH (ref <130)
Total CHOL/HDL Ratio: 2.2 (calc) (ref <5.0)
Triglycerides: 55 mg/dL (ref <150)

## 2020-07-07 ENCOUNTER — Telehealth: Payer: Self-pay

## 2020-07-07 NOTE — Telephone Encounter (Signed)
Patient called back about lab results  

## 2020-07-07 NOTE — Telephone Encounter (Signed)
I returned pt's call. Pt had no questions or concerns, she returned CMA's call. Labs results were seen via My Chart.

## 2020-12-31 ENCOUNTER — Encounter: Payer: Self-pay | Admitting: Family Medicine

## 2020-12-31 ENCOUNTER — Other Ambulatory Visit: Payer: Self-pay

## 2020-12-31 ENCOUNTER — Encounter: Payer: Self-pay | Admitting: Physician Assistant

## 2020-12-31 ENCOUNTER — Ambulatory Visit: Payer: Medicare PPO | Admitting: Family Medicine

## 2020-12-31 VITALS — BP 138/80 | HR 94 | Temp 98.0°F | Ht 61.0 in | Wt 168.8 lb

## 2020-12-31 DIAGNOSIS — R413 Other amnesia: Secondary | ICD-10-CM | POA: Diagnosis not present

## 2020-12-31 DIAGNOSIS — I10 Essential (primary) hypertension: Secondary | ICD-10-CM

## 2020-12-31 DIAGNOSIS — E785 Hyperlipidemia, unspecified: Secondary | ICD-10-CM

## 2020-12-31 NOTE — Patient Instructions (Addendum)
Health Maintenance Due  Topic Date Due   Zoster Vaccines- Shingrix (1 of 2) Check with your local pharmacy.  Never done   COVID-19 Vaccine (4 - Booster for [employer] series) discuss.  09/04/2020  Consider Tdap at phramacy as well  Labs next visit  Recommended follow up: No follow-ups on file.

## 2020-12-31 NOTE — Progress Notes (Signed)
Phone [phone] In person visit   Subjective:   Lori Ramirez is a 87 y.o. year old very pleasant female patient who presents for/with See problem oriented charting Chief Complaint  Patient presents with   Hyperlipidemia   Hypertension   This visit occurred during the SARS-CoV-2 public health emergency.  Safety protocols were in place, including screening questions prior to the visit, additional usage of staff PPE, and extensive cleaning of exam room while observing appropriate contact time as indicated for disinfecting solutions.   Past Medical History-  Patient Active Problem List   Diagnosis Date Noted   Memory loss 07/02/2019    Priority: High   Hyperlipidemia 07/12/2016    Priority: Medium   Essential hypertension 05/07/2015    Priority: Medium   Obesity (BMI 30-39.9) 05/07/2015    Priority: Medium   Low back pain 05/09/2017    Priority: Low   Senile purpura (HCC) 01/17/2017    Priority: Low   S/P left THA, AA 03/19/2013    Priority: Low   Nonspecific pain in the lumbar region 06/04/2008    Priority: Low   Risk for falls 07/02/2019   Pain of both hip joints 06/14/2018    Medications- reviewed and updated Current Outpatient Medications  Medication Sig Dispense Refill   amLODipine (NORVASC) 2.5 MG tablet Take 1 tablet (2.5 mg total) by mouth daily. 90 tablet 3   calcium gluconate 500 MG tablet Take 500 mg by mouth daily.     clindamycin (CLEOCIN) 300 MG capsule TAKE 3 CAPSULES BY MOUTH 1 HOUR PRIOR TO DENTAL PROCEDURE     Cyanocobalamin (VITAMIN B-12 PO) Take by mouth.     hydrochlorothiazide (HYDRODIURIL) 25 MG tablet Take 1 tablet (25 mg total) by mouth daily. 90 tablet 3   VITAMIN D PO Take by mouth.     No current facility-administered medications for this visit.     Objective:  BP 138/80   Pulse 94   Temp 98 F (36.7 C) (Temporal)   Ht 5\' 1"  (1.549 m)   Wt 168 lb 12.8 oz (76.6 kg)   SpO2 95%   BMI 31.89 kg/m  Gen: NAD, resting comfortably CV:  RRR no murmurs rubs or gallops Lungs: CTAB no crackles, wheeze, rhonchi Abdomen: soft/nontender/nondistended. No rebound or guarding.  Ext: no edema Skin: warm, dry Neuro: Some confusion    Assessment and Plan   # memory changes/loss S: MMSE 26 out of 30 on December 2021 down from 28 out of 30 previously. Previously declined Neurology referral-  -husband reports worsening memory- she notes that as well. A/P: After discussion today and encouragement from both myself and her husband patient agrees to see neurology-discussed she may need neuropsychological testing -Labs have been largely unremarkable but we have not done MRI yet-this will likely be discussed with neurology-since progression is gradual I do not strongly suspect vascular dementia  #hypertension S: medication: HCTZ 25Mg , Amlodipine 2.5Mg   Home readings #s: none taken at home. BP Readings from Last 3 Encounters:  12/31/20 138/80  07/02/20 (!) 162/84  12/31/19 132/78  A/P: Stable. Continue current medications.   #hyperlipidemia-statin not indicated for primary prevention S: Medication:None Lab Results  Component Value Date   CHOL 261 (H) 07/02/2020   HDL 119 07/02/2020   LDLCALC 127 (H) 07/02/2020   LDLDIRECT 120.5 12/24/2012   TRIG 55 07/02/2020   CHOLHDL 2.2 07/02/2020  A/P: We discussed multiple control cholesterol.  With that being said over age 45 I typically do not start medication  unless we have evidence of cardiovascular disease which I do not  # Obesity S: she has lost weight. Exercise and Diet- She is trying to eat healthier.  Husband is doing much of the cooking now  Wt Readings from Last 3 Encounters:  12/31/20 168 lb 12.8 oz (76.6 kg)  07/02/20 174 lb 9.6 oz (79.2 kg)  12/31/19 172 lb (78 kg)   A/P: Congratulated patient on weight loss-continue efforts for healthy eating/regular exercise  #Health maintenance-no bone density on file-offered today and she declines. -iMmunizations-declines Tdap,  Shingrix, COVID-19 vaccination #4 for now  Recommended follow up: Return in about 6 months (around 07/02/2021) for physical or sooner if needed.  Lab/Order associations:   ICD-10-CM   1. Essential hypertension  I10     2. Hyperlipidemia, unspecified hyperlipidemia type  E78.5     3. Memory loss  R41.3 Ambulatory referral to Neurology     I,Harris Phan,acting as a scribe for Tana Conch, MD.,have documented all relevant documentation on the behalf of Tana Conch, MD,as directed by  Tana Conch, MD while in the presence of Tana Conch, MD.   I, Tana Conch, MD, have reviewed all documentation for this visit. The documentation on 12/31/20 for the exam, diagnosis, procedures, and orders are all accurate and complete.   Return precautions advised.  Tana Conch, MD

## 2021-01-07 ENCOUNTER — Ambulatory Visit: Payer: Medicare PPO | Admitting: Physician Assistant

## 2021-01-07 ENCOUNTER — Ambulatory Visit (INDEPENDENT_AMBULATORY_CARE_PROVIDER_SITE_OTHER): Payer: Medicare PPO | Admitting: Physician Assistant

## 2021-01-07 ENCOUNTER — Other Ambulatory Visit: Payer: Self-pay

## 2021-01-07 ENCOUNTER — Encounter: Payer: Self-pay | Admitting: Physician Assistant

## 2021-01-07 VITALS — BP 162/79 | HR 102 | Ht 64.0 in | Wt 169.0 lb

## 2021-01-07 DIAGNOSIS — F0390 Unspecified dementia without behavioral disturbance: Secondary | ICD-10-CM | POA: Insufficient documentation

## 2021-01-07 DIAGNOSIS — R413 Other amnesia: Secondary | ICD-10-CM

## 2021-01-07 DIAGNOSIS — F03B0 Unspecified dementia, moderate, without behavioral disturbance, psychotic disturbance, mood disturbance, and anxiety: Secondary | ICD-10-CM

## 2021-01-07 MED ORDER — DONEPEZIL HCL 10 MG PO TABS
ORAL_TABLET | ORAL | 3 refills | Status: DC
Start: 2021-01-07 — End: 2021-01-11

## 2021-01-07 NOTE — Patient Instructions (Addendum)
It was a pleasure to see you today at our office.   Recommendations:  Meds: Neurocognitive exam  B12 and TSH  MRI brain  We will start donepezil half tablet (5mg ) daily for 2  weeks.  If you are tolerating the medication, then after 2 weeks, we will increase the dose to a full tablet of 10mg  daily.  Side effects include nausea, vomiting, diarrhea, vivid dreams, and muscle cramps.  Please call the clinic if you experience any of these symptoms.  Follow up when results of the tests are back    RECOMMENDATIONS FOR ALL PATIENTS WITH MEMORY PROBLEMS: 1. Continue to exercise (Recommend 30 minutes of walking everyday, or 3 hours every week) 2. Increase social interactions - continue going to [place] and enjoy social gatherings with friends and family 3. Eat healthy, avoid fried foods and eat more fruits and vegetables 4. Maintain adequate blood pressure, blood sugar, and blood cholesterol level. Reducing the risk of stroke and cardiovascular disease also helps promoting better memory. 5. Avoid stressful situations. Live a simple life and avoid aggravations. Organize your time and prepare for the next day in anticipation. 6. Sleep well, avoid any interruptions of sleep and avoid any distractions in the bedroom that may interfere with adequate sleep quality 7. Avoid sugar, avoid sweets as there is a strong link between excessive sugar intake, diabetes, and cognitive impairment We discussed the Mediterranean diet, which has been shown to help patients reduce the risk of progressive memory disorders and reduces cardiovascular risk. This includes eating fish, eat fruits and green leafy vegetables, nuts like almonds and hazelnuts, walnuts, and also use olive oil. Avoid fast foods and fried foods as much as possible. Avoid sweets and sugar as sugar use has been linked to worsening of memory function.  There is always a concern of gradual progression of memory problems. If this is the case, then we may need to  adjust level of care according to patient needs. Support, both to the patient and caregiver, should then be put into place.    The Alzheimer's Association is here all day, every day for people facing Alzheimer's disease through our free 24/7 Helpline: [phone]. The Helpline provides reliable information and support to all those who need assistance, such as individuals living with memory loss, Alzheimer's or other dementia, caregivers, health care professionals and the public.  Our highly trained and knowledgeable staff can help you with: Understanding memory loss, dementia and Alzheimer's  Medications and other treatment options  General information about aging and brain health  Skills to provide quality care and to find the best care from professionals  Legal, financial and living-arrangement decisions Our Helpline also features: Confidential care consultation provided by master's level clinicians who can help with decision-making support, crisis assistance and education on issues families face every day  Help in a caller's preferred language using our translation service that features more than 200 languages and dialects  Referrals to local community programs, services and ongoing support     FALL PRECAUTIONS: Be cautious when walking. Scan the area for obstacles that may increase the risk of trips and falls. When getting up in the mornings, sit up at the edge of the bed for a few minutes before getting out of bed. Consider elevating the bed at the head end to avoid drop of blood pressure when getting up. Walk always in a well-lit room (use night lights in the walls). Avoid area rugs or power cords from appliances in the middle of the  walkways. Use a walker or a cane if necessary and consider physical therapy for balance exercise. Get your eyesight checked regularly.  FINANCIAL OVERSIGHT: Supervision, especially oversight when making financial decisions or transactions is also  recommended.  HOME SAFETY: Consider the safety of the kitchen when operating appliances like stoves, microwave oven, and blender. Consider having supervision and share cooking responsibilities until no longer able to participate in those. Accidents with firearms and other hazards in the house should be identified and addressed as well.   ABILITY TO BE LEFT ALONE: If patient is unable to contact 911 operator, consider using LifeLine, or when the need is there, arrange for someone to stay with patients. Smoking is a fire hazard, consider supervision or cessation. Risk of wandering should be assessed by caregiver and if detected at any point, supervision and safe proof recommendations should be instituted.  MEDICATION SUPERVISION: Inability to self-administer medication needs to be constantly addressed. Implement a mechanism to ensure safe administration of the medications.   DRIVING: Regarding driving, in patients with progressive memory problems, driving will be impaired. We advise to have someone else do the driving if trouble finding directions or if minor accidents are reported. Independent driving assessment is available to determine safety of driving.   If you are interested in the driving assessment, you can contact the following:  [facility] [employer] in [place] [phone]  [facility] [phone]  [facility] [phone] or [phone]      Mediterranean Diet A Mediterranean diet refers to food and lifestyle choices that are based on the traditions of countries located on the [employer]. This way of eating has been shown to help prevent certain conditions and improve outcomes for people who have chronic diseases, like kidney disease and heart disease. What are tips for following this plan? Lifestyle  Cook and eat meals together with your family, when possible. Drink enough fluid to keep your urine  clear or pale yellow. Be physically active every day. This includes: Aerobic exercise like running or swimming. Leisure activities like gardening, walking, or housework. Get 7-8 hours of sleep each night. If recommended by your health care provider, drink red wine in moderation. This means 1 glass a day for nonpregnant women and 2 glasses a day for men. A glass of wine equals 5 oz (150 mL). Reading food labels  Check the serving size of packaged foods. For foods such as rice and pasta, the serving size refers to the amount of cooked product, not dry. Check the total fat in packaged foods. Avoid foods that have saturated fat or trans fats. Check the ingredients list for added sugars, such as corn syrup. Shopping  At the grocery store, buy most of your food from the areas near the walls of the store. This includes: Fresh fruits and vegetables (produce). Grains, beans, nuts, and seeds. Some of these may be available in unpackaged forms or large amounts (in bulk). Fresh seafood. Poultry and eggs. Low-fat dairy products. Buy whole ingredients instead of prepackaged foods. Buy fresh fruits and vegetables in-season from local farmers markets. Buy frozen fruits and vegetables in resealable bags. If you do not have access to quality fresh seafood, buy precooked frozen shrimp or canned fish, such as tuna, salmon, or sardines. Buy small amounts of raw or cooked vegetables, salads, or olives from the deli or salad bar at your store. Stock your pantry so you always have certain foods on hand, such as olive oil, canned tuna,  canned tomatoes, rice, pasta, and beans. Cooking  Cook foods with extra-virgin olive oil instead of using butter or other vegetable oils. Have meat as a side dish, and have vegetables or grains as your main dish. This means having meat in small portions or adding small amounts of meat to foods like pasta or stew. Use beans or vegetables instead of meat in common dishes like chili or  lasagna. Experiment with different cooking methods. Try roasting or broiling vegetables instead of steaming or sauteing them. Add frozen vegetables to soups, stews, pasta, or rice. Add nuts or seeds for added healthy fat at each meal. You can add these to yogurt, salads, or vegetable dishes. Marinate fish or vegetables using olive oil, lemon juice, garlic, and fresh herbs. Meal planning  Plan to eat 1 vegetarian meal one day each week. Try to work up to 2 vegetarian meals, if possible. Eat seafood 2 or more times a week. Have healthy snacks readily available, such as: Vegetable sticks with hummus. Greek yogurt. Fruit and nut trail mix. Eat balanced meals throughout the week. This includes: Fruit: 2-3 servings a day Vegetables: 4-5 servings a day Low-fat dairy: 2 servings a day Fish, poultry, or lean meat: 1 serving a day Beans and legumes: 2 or more servings a week Nuts and seeds: 1-2 servings a day Whole grains: 6-8 servings a day Extra-virgin olive oil: 3-4 servings a day Limit red meat and sweets to only a few servings a month What are my food choices? Mediterranean diet Recommended Grains: Whole-grain pasta. Brown rice. Bulgar wheat. Polenta. Couscous. Whole-wheat bread. Orpah Cobb. Vegetables: Artichokes. Beets. Broccoli. Cabbage. Carrots. Eggplant. Green beans. Chard. Kale. Spinach. Onions. Leeks. Peas. Squash. Tomatoes. Peppers. Radishes. Fruits: Apples. Apricots. Avocado. Berries. Bananas. Cherries. Dates. Figs. Grapes. Lemons. Melon. Oranges. Peaches. Plums. Pomegranate. Meats and other protein foods: Beans. Almonds. Sunflower seeds. Pine nuts. Peanuts. Cod. Salmon. Scallops. Shrimp. Tuna. Tilapia. Clams. Oysters. Eggs. Dairy: Low-fat milk. Cheese. Greek yogurt. Beverages: Water. Red wine. Herbal tea. Fats and oils: Extra virgin olive oil. Avocado oil. Grape seed oil. Sweets and desserts: [place] yogurt with honey. Baked apples. Poached pears. Trail mix. Seasoning and  other foods: Basil. Cilantro. Coriander. Cumin. Mint. Parsley. Sage. Rosemary. Tarragon. Garlic. Oregano. Thyme. Pepper. Balsalmic vinegar. Tahini. Hummus. Tomato sauce. Olives. Mushrooms. Limit these Grains: Prepackaged pasta or rice dishes. Prepackaged cereal with added sugar. Vegetables: Deep fried potatoes (french fries). Fruits: Fruit canned in syrup. Meats and other protein foods: Beef. Pork. Lamb. Poultry with skin. Hot dogs. Tomasa Blase. Dairy: Ice cream. Sour cream. Whole milk. Beverages: Juice. Sugar-sweetened soft drinks. Beer. Liquor and spirits. Fats and oils: Butter. Canola oil. Vegetable oil. Beef fat (tallow). Lard. Sweets and desserts: Cookies. Cakes. Pies. Candy. Seasoning and other foods: Mayonnaise. Premade sauces and marinades. The items listed may not be a complete list. Talk with your dietitian about what dietary choices are right for you. Summary The Mediterranean diet includes both food and lifestyle choices. Eat a variety of fresh fruits and vegetables, beans, nuts, seeds, and whole grains. Limit the amount of red meat and sweets that you eat. Talk with your health care provider about whether it is safe for you to drink red wine in moderation. This means 1 glass a day for nonpregnant women and 2 glasses a day for men. A glass of wine equals 5 oz (150 mL). This information is not intended to replace advice given to you by your health care provider. Make sure you discuss any questions you have with  your health care provider. Document Released: 03/03/2016 Document Revised: 04/05/2016 Document Reviewed: 03/03/2016 Elsevier Interactive Patient Education  2017 [employer].

## 2021-01-07 NOTE — Progress Notes (Signed)
Assessment/Plan:   Lori Ramirez is a 87 y.o. year old female with risk factors including age hypertension, hyperlipidemia, seen today for evaluation of memory loss.  MoCA today is 12/30 with significant deficiencies in visuospatial -executive, memory, and especially language, with 0/5 delayed recall.  Orientation 4/6, unable to tell the year or the place.  Findings are highly suspicious for dementia, possibly due to Alzheimer's disease.  Will proceed with further workup. Recommendations are as follows   Moderate dementia, likely due to Alzheimer's disease without behavioral disturbance  MRI of the brain to evaluate for bleeding, brain size and other abnormalities Neurocognitive testing to further determine what causes memory changes including sleep, stress, anxiety depression.  Case discussed with Dr. Milbert Coulter, who agreed that this would be appropriate. Check B12,TSH.  Discussed safety both in and out of the home.  Home PT was recommended, and patient adamantly declined at this time.   Discussed the importance of regular daily schedule Naps should be scheduled and should be no longer than 60 minutes and should not occur after 2 PM.  We will start donepezil half tablet (5mg ) daily for 2  weeks. Then after 2 weeks, we will increase the dose to a full tablet of 10mg  daily.  Side effects include nausea, vomiting, diarrhea, vivid dreams, and muscle cramps.  Pending on the results of all of the above testing, this therapy may continued versus changing to other agents such as Memantine. Folllow up once results above are available     Subjective:    The patient is seen in neurologic consultation at the request of , MD for the evaluation of memory.  The patient is accompanied by husband [name] who supplements the history.  The patient is a 87 y.o. year old female who has had memory issues for about  2 years. Dr. Shelva Majestic had recommended Neuro evaluation at the end of 2020, but she  declined until now, when her memory loss has become more noticeable by her husband [name]. Back then, was 28/30.  A repeat at Dr. Durene Cal office yielded a 26/30.  Although she feels that her memory is fair, her husband reports that she cannot remember places, or people that she actually knew.  She claims this is due to a lack of interaction due to the pandemic.  However, her husband is 67 years, reports that she is now having significant cognitive decline, especially over the last 6 months.  He states that she is highly educated, very smart, and very well travelled.  She is a retired 2021.  Husband states that now, she has become very "annoying ", she stays in the same room, where she states 80% of the time, pointing at things that are not there, or talks about meaningless issues such as "why is in the squirrel outside, or why are these golfers in my backyard, why those two women looking at me etc."  She reports being" okay", and that her mood is "nothing ", denying depression or irritability.  She denies hallucinations or paranoia, or vivid dreams or sleepwalking.  He reports that her long-term memory is still good.  She now has to bathe with the help of her husband, who has to dry her, put her underwear on, and her shoes on.  She no longer takes her medications by herself.  Her husband places daily and takes them in front of her husband every morning and every afternoon.  She misplaces, objects, such as dipstick, but denies  placing them in unusual locations.  Her husband has taken over the finances, and lately the cooking.  She is to be a good cook, but ever since she left the stove on about 3 months ago, she is only dedicated to making a salad.  She is to know very well her recipes, and now she cannot read or follow those instructions if these recipes were written.  She no longer drives.  She has been ambulating with difficulty due to arthritis. Repeatedly, she has declined PT evaluation as  recommended by her PCP, "because it was not helping for when I had my hip surgery ".  She has several mechanical falls over the last year without head injury, but admits that her balance is off as well, especially with a cane.  She is mostly now ambulating with a walker.  Denies headaches, trauma, or injuries to the head, double vision, dizziness, focal numbness or tingling, unilateral weakness or tremors.  She has urine incontinence, denies retention.  She denies any constipation or diarrhea.  Denies any history of OSA, alcohol or tobacco.  Family history negative for Alzheimer's disease.     Last labs performed on 07/02/2020 showed total cholesterol of 261, with LDL of 127, elevated.  Normal HDL and TG.;  CBC and CMP were normal   Allergies  Allergen Reactions   Amoxicillin    Cortisone Acetate     REACTION: rash   Iodine     REACTION: rash   Prednisone     REACTION: rash   Triamcinolone Acetonide     REACTION: rash    Current Outpatient Medications  Medication Instructions   amLODipine (NORVASC) 2.5 mg, Oral, Daily   bisacodyl (DULCOLAX) 5 mg, Oral, Daily PRN   calcium gluconate 500 mg, Daily   clindamycin (CLEOCIN) 300 MG capsule TAKE 3 CAPSULES BY MOUTH 1 HOUR PRIOR TO DENTAL PROCEDURE   Cyanocobalamin (VITAMIN B-12 PO) Oral   hydrochlorothiazide (HYDRODIURIL) 25 mg, Oral, Daily   VITAMIN D PO Oral     VITALS:   Vitals:   01/07/21 1336  BP: (!) 162/79  Pulse: (!) 102  SpO2: 95%  Weight: 169 lb (76.7 kg)  Height: 5\' 4"  (1.626 m)   Depression screen [facility] 2/9 12/31/2020 07/02/2020 07/02/2019 12/13/2018 01/18/2018  Decreased Interest 0 0 0 0 0  Down, Depressed, Hopeless 0 0 0 0 0  PHQ - 2 Score 0 0 0 0 0    HEENT:  Normocephalic, atraumatic.  Flat affect.  The mucous membranes are moist. The superficial temporal arteries are without ropiness or tenderness. Cardiovascular: Regular rate and rhythm. Lungs: Clear to auscultation bilaterally. Neck: There are no carotid bruits  noted bilaterally.  NEUROLOGICAL:  Orientation:   Montreal Cognitive Assessment  01/07/2021  Visuospatial/ Executive (0/5) 0  Naming (0/3) 3  Attention: Read list of digits (0/2) 2  Attention: Read list of letters (0/1) 1  Attention: Serial 7 subtraction starting at 100 (0/3) 2  Language: Repeat phrase (0/2) 0  Language : Fluency (0/1) 0  Abstraction (0/2) 0  Delayed Recall (0/5) 0  Orientation (0/6) 4  Total 12  Adjusted Score (based on education) 12   Alert and oriented to person, Date, and month, as well as the day of the week and, this is [place].  She is unable to say what year or place is. No aphasia or dysarthria. Fund of knowledge is reduced. Recent memory is impaired and remote memory is normal attention and concentration are reduced.  Able to  name objects but unable to repeat phrases. Cranial nerves: There is good facial symmetry. Extraocular muscles are intact and visual fields are full to confrontational testing. Speech is fluent and clear. Soft palate rises symmetrically and there is no tongue deviation. Hearing is intact to conversational tone. Tone: Tone is good throughout. Sensation: Sensation is intact to light touch and pinprick throughout. Vibration is intact at the bilateral big toe.There is no extinction with double simultaneous stimulation. There is no sensory dermatomal level identified. Coordination: The patient has mild difficulty with RAM's or FNF bilaterally. Normal finger to nose  Motor: Strength is 5/5 in the bilateral upper and lower extremities. There is no pronator drift. There are no fasciculations noted. DTR's: Deep tendon reflexes are 1/4 at the bilateral biceps, triceps, brachioradialis, patella and achilles.  Plantar responses are downgoing bilaterally. Gait and Station: The patient is able to ambulate with some difficulty due to arthritis, but with a walker only.The patient is unable to heel toe walk  The patient is unable to stand in the Romberg  position.   CBC Latest Ref Rng & Units 07/02/2020 12/31/2019 07/02/2019  WBC 3.8 - 10.8 Thousand/uL 4.1 3.3(L) 3.7(L)  Hemoglobin 11.7 - 15.5 g/dL 28.3 15.1 76.1  Hematocrit 35.0 - 45.0 % 39.5 38.9 41.7  Platelets 140 - 400 Thousand/uL 201 178.0 188.0     CMP Latest Ref Rng & Units 07/02/2020 12/31/2019 07/02/2019  Glucose 65 - 99 mg/dL 90 93 79  BUN 7 - 25 mg/dL 25 60(V) 23  Creatinine 0.60 - 0.88 mg/dL 3.71 0.62 6.94  Sodium 135 - 146 mmol/L 137 136 135  Potassium 3.5 - 5.3 mmol/L 3.7 4.2 4.5  Chloride 98 - 110 mmol/L 98 98 97  CO2 20 - 32 mmol/L 33(H) 32 33(H)  Calcium 8.6 - 10.4 mg/dL 9.7 9.7 9.9  Total Protein 6.1 - 8.1 g/dL 6.4 - 6.4  Total Bilirubin 0.2 - 1.2 mg/dL 0.7 - 0.7  Alkaline Phos 39 - 117 U/L - - 55  AST 10 - 35 U/L 20 - 19  ALT 6 - 29 U/L 14 - 13      Thank you for allowing [place] the opportunity to participate in the care of this nice patient. Please do not hesitate to contact us for any questions or concerns.   Total time spent on today's visit was 60 minutes, including both face-to-face time and nonface-to-face time.  Time included that spent on review of records (prior notes available to me/labs/imaging if pertinent), discussing treatment and goals, answering patient's questions and coordinating care.  Cc:  Shelva Majestic, MD  Marlowe Kays 01/07/2021 2:28 PM

## 2021-01-11 ENCOUNTER — Telehealth: Payer: Self-pay | Admitting: Physician Assistant

## 2021-01-11 ENCOUNTER — Other Ambulatory Visit: Payer: Self-pay | Admitting: Physician Assistant

## 2021-01-11 ENCOUNTER — Encounter: Payer: Self-pay | Admitting: Physician Assistant

## 2021-01-11 NOTE — Telephone Encounter (Signed)
Husband Lori Ramirez called in and wants a call back regarding Lori Ramirez medicine. He said she took 1/2 pill sat and Sunday. Sunday at 1pm she started feeling bad. Chills, felt like she was going to vomit, and went to bathroom a lot. Then he got her warm and she slept for 4 hrs. He said she will not be taking meds again until he hears from doctor. Meds donepezil

## 2021-01-11 NOTE — Telephone Encounter (Signed)
Sent mychart message

## 2021-01-12 ENCOUNTER — Other Ambulatory Visit: Payer: Medicare PPO

## 2021-01-12 DIAGNOSIS — R413 Other amnesia: Secondary | ICD-10-CM

## 2021-01-13 ENCOUNTER — Telehealth: Payer: Self-pay | Admitting: Physician Assistant

## 2021-01-13 LAB — VITAMIN B12: Vitamin B-12: >1550 pg/mL — ABNORMAL HIGH (ref 211–911)

## 2021-01-13 LAB — TSH: TSH: 2.55 u[IU]/mL (ref 0.35–4.50)

## 2021-01-13 NOTE — Telephone Encounter (Signed)
Patient advised.

## 2021-01-14 ENCOUNTER — Encounter: Payer: Self-pay | Admitting: Physician Assistant

## 2021-01-18 ENCOUNTER — Encounter: Payer: Self-pay | Admitting: Physician Assistant

## 2021-01-18 MED ORDER — RIVASTIGMINE TARTRATE 1.5 MG PO CAPS: 1.5000 mg | ORAL_CAPSULE | Freq: Two times a day (BID) | ORAL | 11 refills | Status: DC

## 2021-01-19 ENCOUNTER — Telehealth: Payer: Self-pay | Admitting: Physician Assistant

## 2021-01-19 NOTE — Telephone Encounter (Signed)
Entered in error

## 2021-01-27 NOTE — Progress Notes (Signed)
Duplicate order.

## 2021-02-09 DIAGNOSIS — R413 Other amnesia: Secondary | ICD-10-CM | POA: Diagnosis not present

## 2021-02-09 DIAGNOSIS — Z8673 Personal history of transient ischemic attack (TIA), and cerebral infarction without residual deficits: Secondary | ICD-10-CM | POA: Diagnosis not present

## 2021-02-12 ENCOUNTER — Encounter: Payer: Self-pay | Admitting: Physician Assistant

## 2021-02-14 ENCOUNTER — Encounter: Payer: Self-pay | Admitting: Physician Assistant

## 2021-02-15 ENCOUNTER — Encounter: Payer: Self-pay | Admitting: Physician Assistant

## 2021-03-01 DIAGNOSIS — D1801 Hemangioma of skin and subcutaneous tissue: Secondary | ICD-10-CM | POA: Diagnosis not present

## 2021-03-01 DIAGNOSIS — L821 Other seborrheic keratosis: Secondary | ICD-10-CM | POA: Diagnosis not present

## 2021-03-01 DIAGNOSIS — D692 Other nonthrombocytopenic purpura: Secondary | ICD-10-CM | POA: Diagnosis not present

## 2021-03-01 DIAGNOSIS — L905 Scar conditions and fibrosis of skin: Secondary | ICD-10-CM | POA: Diagnosis not present

## 2021-03-01 NOTE — Progress Notes (Signed)
Phone [phone] In person visit   Subjective:   Lori Ramirez is a 87 y.o. year old very pleasant female patient who presents for/with See problem oriented charting Chief Complaint  Patient presents with   Fall    Patient fell 8/2 at 8:30 and her husband was unable to lift her off the floor, He called 911 and EMS got there in helped him get off the floor and her BP was 200/110. After EMS finished they assisted with getting her to bed.    This visit occurred during the SARS-CoV-2 public health emergency.  Safety protocols were in place, including screening questions prior to the visit, additional usage of staff PPE, and extensive cleaning of exam room while observing appropriate contact time as indicated for disinfecting solutions.   Past Medical History-  Patient Active Problem List   Diagnosis Date Noted   Memory loss 07/02/2019    Priority: High   Hyperlipidemia 07/12/2016    Priority: Medium   Essential hypertension 05/07/2015    Priority: Medium   Obesity (BMI 30-39.9) 05/07/2015    Priority: Medium   Low back pain 05/09/2017    Priority: Low   Senile purpura (HCC) 01/17/2017    Priority: Low   S/P left THA, AA 03/19/2013    Priority: Low   Nonspecific pain in the lumbar region 06/04/2008    Priority: Low   Moderate dementia without behavioral disturbance (HCC) 01/07/2021   Risk for falls 07/02/2019   Pain of both hip joints 06/14/2018    Medications- reviewed and updated Current Outpatient Medications  Medication Sig Dispense Refill   amLODipine (NORVASC) 2.5 MG tablet Take 1 tablet (2.5 mg total) by mouth daily. 90 tablet 3   bisacodyl (DULCOLAX) 5 MG EC tablet Take 5 mg by mouth daily as needed for moderate constipation.     calcium gluconate 500 MG tablet Take 500 mg by mouth daily.     clindamycin (CLEOCIN) 300 MG capsule      hydrochlorothiazide (HYDRODIURIL) 25 MG tablet Take 1 tablet (25 mg total) by mouth daily. 90 tablet 3   rivastigmine  (EXELON) 1.5 MG capsule Take 1 capsule (1.5 mg total) by mouth 2 (two) times daily. 60 capsule 11   VITAMIN D PO Take by mouth.     Cyanocobalamin (VITAMIN B-12 PO) Take by mouth. (Patient not taking: Reported on 03/05/2021)     No current facility-administered medications for this visit.     Objective:  BP 125/75 Comment: at home readings  Pulse 80   Temp 98.2 F (36.8 C) (Temporal)   Ht 5\' 4"  (1.626 m)   Wt 168 lb (76.2 kg)   SpO2 98%   BMI 28.84 kg/m  Gen: NAD, resting comfortably CV: RRR no murmurs rubs or gallops Lungs: CTAB no crackles, wheeze, rhonchi Abdomen: soft/nontender/nondistended/normal bowel sounds. No rebound or guarding.  Ext: no edema Skin: warm, dry Neuro: sensation and reflexes normal throughout, 5/5 muscle strength in bilateral upper and lower extremities.  No pronator drift. Normal romberg. Does not appear confident on her feet though- smaller steps particularly without cane     Assessment and Plan   # Fall S: Patient fell on February 23, 2021 at 8:30 PM-husband was able to lift her off the floor.  EMS was called and helped patient get up off the floor.  Blood pressure was elevated at that time up to 200/110.  Patient was in no acute distress and EMS encouraged PCP follow-up  Was walking from  couch into kitchen- had trouble lifting up her feet getting up a small step. Husband was in the bathroom and heard her fall while he was waiting. No LOC- husband came in quickly and she was laid out flat on floor- she was coherent. Did not hit head that we are aware of but 2 days later had mild headache- no longer having headache- did not have at time of fall. Mild bruising on right side of chest- fell toward right. Was using rollator waler but it went out in front of her.   No facial or extremity weakness. No slurred words or trouble swallowing. no blurry vision or double vision. No paresthesias. No confusion or word finding difficulties.   Has had similar falls in the  past. Has chair lift for other stairs. Usually gets around outside the home with cane with no issue A/P: Recent fall but has had several falls in same position at home going over small step- will do home health safety eval to further evaluate. Recent MRI was after start of falls so do not need to repeat neuroimaging. No lingering headaches after recent falls- I do not think we need to repeat imaging  -with high BP and no known orthostatic issues  #Moderate dementia-likely due to Alzheimer's disease without behavioral disturbance- S: MMSE 26 out of 30 on July 02, 2019 but later had progressive decline- referred to Craig Guess, PA-C with neurology. Findings were highly suspicious for dementia, possibly due to Alzheimer's disease  Medication: Rivastigmine/exelon 1.5 mg twice daily started July 2022 -debilitating chills on aricept A/P:  patient now with dementia- so far neurology at [facility] has not been the best fit for her- encouraged to continue relationship for now and continue current medicine  - discussed neuropsychological testing- advised I thought this would be helpful- but she declines- encouraged her to call and cancel if worsening  #hypertension S: medication: HCTZ 25 mg daily, Amlodipine 2.5 mg daily  Home readings #s: 125/70 at home BP Readings from Last 3 Encounters:  03/05/21 125/75  01/07/21 (!) 162/79  12/31/20 138/80  A/P: doing well at home and on repeat in office- continue current meds  #hyperlipidemia-statin not indicated for primary prevention S: Medication:none Lab Results  Component Value Date   CHOL 261 (H) 07/02/2020   HDL 119 07/02/2020   LDLCALC 127 (H) 07/02/2020   LDLDIRECT 120.5 12/24/2012   TRIG 55 07/02/2020   CHOLHDL 2.2 07/02/2020   A/P: for primary prevention hold off- but would recommend healthy eating nd regular exercise to help- hopefuly she can increase exercise after seeing PT/OT  #Leukopenia-stable mild leukopenia over the years and other cell  lines reassuring. Updated CBC with blood work  last visit- she wants to wait until next visit to repeat  #Health maintenance-no bone density on file-offered prior - she declines again - plans to wait for variant specific vaccine for covid #4   Recommended follow up: keep January visit or sooner if needed Future Appointments  Date Time Provider Department Center  04/27/2021  1:00 PM Rosann Auerbach, PhD [facility] None  04/27/2021  2:00 PM [facility] None  05/06/2021  2:30 PM Rosann Auerbach, PhD [facility] None  05/27/2021  3:30 PM Marcos Eke, PA-C [facility] None  07/29/2021  9:20 AM Durene Cal Aldine Contes, MD [facility]    Lab/Order associations:   ICD-10-CM   1. Fall, initial encounter  W19.XXXA Ambulatory referral to Home Health    2. Essential hypertension  I10  3. Moderate dementia without behavioral disturbance (HCC)  F03.90     4. Hyperlipidemia, unspecified hyperlipidemia type  E78.5      I,Jada Bradford,acting as a scribe for Tana Conch, MD.,have documented all relevant documentation on the behalf of Tana Conch, MD,as directed by  Tana Conch, MD while in the presence of Tana Conch, MD.  I, Tana Conch, MD, have reviewed all documentation for this visit. The documentation on 03/05/21 for the exam, diagnosis, procedures, and orders are all accurate and complete.   Return precautions advised.  Tana Conch, MD

## 2021-03-05 ENCOUNTER — Other Ambulatory Visit: Payer: Self-pay

## 2021-03-05 ENCOUNTER — Ambulatory Visit: Payer: Medicare PPO | Admitting: Family Medicine

## 2021-03-05 ENCOUNTER — Encounter: Payer: Self-pay | Admitting: Family Medicine

## 2021-03-05 VITALS — BP 125/75 | HR 80 | Temp 98.2°F | Ht 64.0 in | Wt 168.0 lb

## 2021-03-05 DIAGNOSIS — E669 Obesity, unspecified: Secondary | ICD-10-CM

## 2021-03-05 DIAGNOSIS — F0390 Unspecified dementia without behavioral disturbance: Secondary | ICD-10-CM | POA: Diagnosis not present

## 2021-03-05 DIAGNOSIS — F03B0 Unspecified dementia, moderate, without behavioral disturbance, psychotic disturbance, mood disturbance, and anxiety: Secondary | ICD-10-CM

## 2021-03-05 DIAGNOSIS — I10 Essential (primary) hypertension: Secondary | ICD-10-CM

## 2021-03-05 DIAGNOSIS — E785 Hyperlipidemia, unspecified: Secondary | ICD-10-CM

## 2021-03-05 DIAGNOSIS — W19XXXA Unspecified fall, initial encounter: Secondary | ICD-10-CM

## 2021-03-05 NOTE — Patient Instructions (Addendum)
Health Maintenance Due  Topic Date Due   COVID-19 Vaccine (4 - Booster for [employer] series)  -reasonable to get 4th vaccine soon- there may be a new variant specific vaccine available within next month or two 09/04/2020    We will call you within two weeks about your referral to home health. If you do not hear within 2 weeks, give [place] a call.   Today, your blood pressure was 136/78.lets continue current medicine. Make sure to stay well hydrated- I also want you to be particularly careful near that small step to avoid future falls  Recommended follow up: keep January visit or sooner if needed

## 2021-03-08 ENCOUNTER — Other Ambulatory Visit: Payer: Self-pay | Admitting: Family Medicine

## 2021-03-15 ENCOUNTER — Other Ambulatory Visit: Payer: Self-pay | Admitting: Family Medicine

## 2021-03-16 DIAGNOSIS — R413 Other amnesia: Secondary | ICD-10-CM | POA: Diagnosis not present

## 2021-03-16 DIAGNOSIS — M545 Low back pain, unspecified: Secondary | ICD-10-CM | POA: Diagnosis not present

## 2021-03-16 DIAGNOSIS — R296 Repeated falls: Secondary | ICD-10-CM | POA: Diagnosis not present

## 2021-03-16 DIAGNOSIS — M1990 Unspecified osteoarthritis, unspecified site: Secondary | ICD-10-CM | POA: Diagnosis not present

## 2021-03-16 DIAGNOSIS — D692 Other nonthrombocytopenic purpura: Secondary | ICD-10-CM | POA: Diagnosis not present

## 2021-03-16 DIAGNOSIS — F0390 Unspecified dementia without behavioral disturbance: Secondary | ICD-10-CM | POA: Diagnosis not present

## 2021-03-16 DIAGNOSIS — I10 Essential (primary) hypertension: Secondary | ICD-10-CM | POA: Diagnosis not present

## 2021-03-16 DIAGNOSIS — W19XXXD Unspecified fall, subsequent encounter: Secondary | ICD-10-CM | POA: Diagnosis not present

## 2021-03-16 DIAGNOSIS — D72819 Decreased white blood cell count, unspecified: Secondary | ICD-10-CM | POA: Diagnosis not present

## 2021-03-18 ENCOUNTER — Telehealth: Payer: Self-pay

## 2021-03-18 NOTE — Telephone Encounter (Signed)
.  Home Health verbal orders Caller Name:Karen Agency Name: [facility]  Callback number: [phone]  Requesting OT/PT/Skilled nursing/Social Work/Speech : OT   Reason:strengthening ADL training transfer training and home safety    Frequency:1 time a week every other week for 5 weeks and possible video visits to verify compliance   Please forward to [facility] pool or providers CMA

## 2021-03-19 DIAGNOSIS — F0390 Unspecified dementia without behavioral disturbance: Secondary | ICD-10-CM | POA: Diagnosis not present

## 2021-03-19 DIAGNOSIS — R413 Other amnesia: Secondary | ICD-10-CM | POA: Diagnosis not present

## 2021-03-19 DIAGNOSIS — W19XXXD Unspecified fall, subsequent encounter: Secondary | ICD-10-CM | POA: Diagnosis not present

## 2021-03-19 DIAGNOSIS — M545 Low back pain, unspecified: Secondary | ICD-10-CM | POA: Diagnosis not present

## 2021-03-19 DIAGNOSIS — I10 Essential (primary) hypertension: Secondary | ICD-10-CM | POA: Diagnosis not present

## 2021-03-19 DIAGNOSIS — D72819 Decreased white blood cell count, unspecified: Secondary | ICD-10-CM | POA: Diagnosis not present

## 2021-03-19 DIAGNOSIS — D692 Other nonthrombocytopenic purpura: Secondary | ICD-10-CM | POA: Diagnosis not present

## 2021-03-19 DIAGNOSIS — R296 Repeated falls: Secondary | ICD-10-CM | POA: Diagnosis not present

## 2021-03-19 DIAGNOSIS — M1990 Unspecified osteoarthritis, unspecified site: Secondary | ICD-10-CM | POA: Diagnosis not present

## 2021-03-19 NOTE — Telephone Encounter (Signed)
Called and spoke with Clydie Braun and VO provided.

## 2021-03-23 DIAGNOSIS — W19XXXD Unspecified fall, subsequent encounter: Secondary | ICD-10-CM | POA: Diagnosis not present

## 2021-03-23 DIAGNOSIS — F0390 Unspecified dementia without behavioral disturbance: Secondary | ICD-10-CM | POA: Diagnosis not present

## 2021-03-23 DIAGNOSIS — R413 Other amnesia: Secondary | ICD-10-CM | POA: Diagnosis not present

## 2021-03-23 DIAGNOSIS — M1990 Unspecified osteoarthritis, unspecified site: Secondary | ICD-10-CM | POA: Diagnosis not present

## 2021-03-23 DIAGNOSIS — I10 Essential (primary) hypertension: Secondary | ICD-10-CM | POA: Diagnosis not present

## 2021-03-23 DIAGNOSIS — R296 Repeated falls: Secondary | ICD-10-CM | POA: Diagnosis not present

## 2021-03-23 DIAGNOSIS — D692 Other nonthrombocytopenic purpura: Secondary | ICD-10-CM | POA: Diagnosis not present

## 2021-03-23 DIAGNOSIS — M545 Low back pain, unspecified: Secondary | ICD-10-CM | POA: Diagnosis not present

## 2021-03-23 DIAGNOSIS — D72819 Decreased white blood cell count, unspecified: Secondary | ICD-10-CM | POA: Diagnosis not present

## 2021-03-25 DIAGNOSIS — F0390 Unspecified dementia without behavioral disturbance: Secondary | ICD-10-CM | POA: Diagnosis not present

## 2021-03-25 DIAGNOSIS — R413 Other amnesia: Secondary | ICD-10-CM | POA: Diagnosis not present

## 2021-03-25 DIAGNOSIS — I10 Essential (primary) hypertension: Secondary | ICD-10-CM | POA: Diagnosis not present

## 2021-03-25 DIAGNOSIS — R296 Repeated falls: Secondary | ICD-10-CM | POA: Diagnosis not present

## 2021-03-25 DIAGNOSIS — M545 Low back pain, unspecified: Secondary | ICD-10-CM | POA: Diagnosis not present

## 2021-03-25 DIAGNOSIS — W19XXXD Unspecified fall, subsequent encounter: Secondary | ICD-10-CM | POA: Diagnosis not present

## 2021-03-25 DIAGNOSIS — D72819 Decreased white blood cell count, unspecified: Secondary | ICD-10-CM | POA: Diagnosis not present

## 2021-03-25 DIAGNOSIS — M1990 Unspecified osteoarthritis, unspecified site: Secondary | ICD-10-CM | POA: Diagnosis not present

## 2021-03-25 DIAGNOSIS — D692 Other nonthrombocytopenic purpura: Secondary | ICD-10-CM | POA: Diagnosis not present

## 2021-03-26 ENCOUNTER — Telehealth: Payer: Self-pay

## 2021-03-26 NOTE — Telephone Encounter (Signed)
Called and spoke with Aurea Graff and VO given.

## 2021-03-26 NOTE — Telephone Encounter (Signed)
Clydie Braun from occupational therapy stated that she was supposed to see Gwynn today for therapy. Clydie Braun stated that she was unable to see Kemyah today because Sharea was not home. Clydie Braun wants to know if she can have verbal orders to make her appt for next week. Clydie Braun can be reached at [phone]. Please Advise.

## 2021-03-29 DIAGNOSIS — D72819 Decreased white blood cell count, unspecified: Secondary | ICD-10-CM | POA: Diagnosis not present

## 2021-03-29 DIAGNOSIS — W19XXXD Unspecified fall, subsequent encounter: Secondary | ICD-10-CM | POA: Diagnosis not present

## 2021-03-29 DIAGNOSIS — R296 Repeated falls: Secondary | ICD-10-CM | POA: Diagnosis not present

## 2021-03-29 DIAGNOSIS — M1990 Unspecified osteoarthritis, unspecified site: Secondary | ICD-10-CM | POA: Diagnosis not present

## 2021-03-29 DIAGNOSIS — F0390 Unspecified dementia without behavioral disturbance: Secondary | ICD-10-CM | POA: Diagnosis not present

## 2021-03-29 DIAGNOSIS — D692 Other nonthrombocytopenic purpura: Secondary | ICD-10-CM | POA: Diagnosis not present

## 2021-03-29 DIAGNOSIS — M545 Low back pain, unspecified: Secondary | ICD-10-CM | POA: Diagnosis not present

## 2021-03-29 DIAGNOSIS — I10 Essential (primary) hypertension: Secondary | ICD-10-CM | POA: Diagnosis not present

## 2021-03-29 DIAGNOSIS — R413 Other amnesia: Secondary | ICD-10-CM | POA: Diagnosis not present

## 2021-03-30 DIAGNOSIS — D692 Other nonthrombocytopenic purpura: Secondary | ICD-10-CM | POA: Diagnosis not present

## 2021-03-30 DIAGNOSIS — I10 Essential (primary) hypertension: Secondary | ICD-10-CM | POA: Diagnosis not present

## 2021-03-30 DIAGNOSIS — R413 Other amnesia: Secondary | ICD-10-CM | POA: Diagnosis not present

## 2021-03-30 DIAGNOSIS — F0390 Unspecified dementia without behavioral disturbance: Secondary | ICD-10-CM | POA: Diagnosis not present

## 2021-03-30 DIAGNOSIS — R296 Repeated falls: Secondary | ICD-10-CM | POA: Diagnosis not present

## 2021-03-30 DIAGNOSIS — D72819 Decreased white blood cell count, unspecified: Secondary | ICD-10-CM | POA: Diagnosis not present

## 2021-03-30 DIAGNOSIS — M545 Low back pain, unspecified: Secondary | ICD-10-CM | POA: Diagnosis not present

## 2021-03-30 DIAGNOSIS — W19XXXD Unspecified fall, subsequent encounter: Secondary | ICD-10-CM | POA: Diagnosis not present

## 2021-03-30 DIAGNOSIS — M1990 Unspecified osteoarthritis, unspecified site: Secondary | ICD-10-CM | POA: Diagnosis not present

## 2021-04-01 DIAGNOSIS — M545 Low back pain, unspecified: Secondary | ICD-10-CM | POA: Diagnosis not present

## 2021-04-01 DIAGNOSIS — D692 Other nonthrombocytopenic purpura: Secondary | ICD-10-CM | POA: Diagnosis not present

## 2021-04-01 DIAGNOSIS — M1990 Unspecified osteoarthritis, unspecified site: Secondary | ICD-10-CM | POA: Diagnosis not present

## 2021-04-01 DIAGNOSIS — F0390 Unspecified dementia without behavioral disturbance: Secondary | ICD-10-CM | POA: Diagnosis not present

## 2021-04-01 DIAGNOSIS — W19XXXD Unspecified fall, subsequent encounter: Secondary | ICD-10-CM | POA: Diagnosis not present

## 2021-04-01 DIAGNOSIS — R413 Other amnesia: Secondary | ICD-10-CM | POA: Diagnosis not present

## 2021-04-01 DIAGNOSIS — I10 Essential (primary) hypertension: Secondary | ICD-10-CM | POA: Diagnosis not present

## 2021-04-01 DIAGNOSIS — D72819 Decreased white blood cell count, unspecified: Secondary | ICD-10-CM | POA: Diagnosis not present

## 2021-04-01 DIAGNOSIS — R296 Repeated falls: Secondary | ICD-10-CM | POA: Diagnosis not present

## 2021-04-05 DIAGNOSIS — R296 Repeated falls: Secondary | ICD-10-CM | POA: Diagnosis not present

## 2021-04-05 DIAGNOSIS — M545 Low back pain, unspecified: Secondary | ICD-10-CM | POA: Diagnosis not present

## 2021-04-05 DIAGNOSIS — Z96642 Presence of left artificial hip joint: Secondary | ICD-10-CM

## 2021-04-05 DIAGNOSIS — Z6831 Body mass index (BMI) 31.0-31.9, adult: Secondary | ICD-10-CM

## 2021-04-05 DIAGNOSIS — E785 Hyperlipidemia, unspecified: Secondary | ICD-10-CM | POA: Diagnosis not present

## 2021-04-05 DIAGNOSIS — D692 Other nonthrombocytopenic purpura: Secondary | ICD-10-CM | POA: Diagnosis not present

## 2021-04-05 DIAGNOSIS — E669 Obesity, unspecified: Secondary | ICD-10-CM | POA: Diagnosis not present

## 2021-04-05 DIAGNOSIS — Z9181 History of falling: Secondary | ICD-10-CM

## 2021-04-05 DIAGNOSIS — W19XXXD Unspecified fall, subsequent encounter: Secondary | ICD-10-CM | POA: Diagnosis not present

## 2021-04-05 DIAGNOSIS — R413 Other amnesia: Secondary | ICD-10-CM | POA: Diagnosis not present

## 2021-04-05 DIAGNOSIS — I10 Essential (primary) hypertension: Secondary | ICD-10-CM | POA: Diagnosis not present

## 2021-04-05 DIAGNOSIS — D72819 Decreased white blood cell count, unspecified: Secondary | ICD-10-CM | POA: Diagnosis not present

## 2021-04-05 DIAGNOSIS — M1990 Unspecified osteoarthritis, unspecified site: Secondary | ICD-10-CM | POA: Diagnosis not present

## 2021-04-05 DIAGNOSIS — F0390 Unspecified dementia without behavioral disturbance: Secondary | ICD-10-CM | POA: Diagnosis not present

## 2021-04-06 DIAGNOSIS — D692 Other nonthrombocytopenic purpura: Secondary | ICD-10-CM | POA: Diagnosis not present

## 2021-04-06 DIAGNOSIS — F0390 Unspecified dementia without behavioral disturbance: Secondary | ICD-10-CM | POA: Diagnosis not present

## 2021-04-06 DIAGNOSIS — D72819 Decreased white blood cell count, unspecified: Secondary | ICD-10-CM | POA: Diagnosis not present

## 2021-04-06 DIAGNOSIS — M1990 Unspecified osteoarthritis, unspecified site: Secondary | ICD-10-CM | POA: Diagnosis not present

## 2021-04-06 DIAGNOSIS — W19XXXD Unspecified fall, subsequent encounter: Secondary | ICD-10-CM | POA: Diagnosis not present

## 2021-04-06 DIAGNOSIS — R413 Other amnesia: Secondary | ICD-10-CM | POA: Diagnosis not present

## 2021-04-06 DIAGNOSIS — I10 Essential (primary) hypertension: Secondary | ICD-10-CM | POA: Diagnosis not present

## 2021-04-06 DIAGNOSIS — M545 Low back pain, unspecified: Secondary | ICD-10-CM | POA: Diagnosis not present

## 2021-04-06 DIAGNOSIS — R296 Repeated falls: Secondary | ICD-10-CM | POA: Diagnosis not present

## 2021-04-13 DIAGNOSIS — M1990 Unspecified osteoarthritis, unspecified site: Secondary | ICD-10-CM | POA: Diagnosis not present

## 2021-04-13 DIAGNOSIS — F0390 Unspecified dementia without behavioral disturbance: Secondary | ICD-10-CM | POA: Diagnosis not present

## 2021-04-13 DIAGNOSIS — W19XXXD Unspecified fall, subsequent encounter: Secondary | ICD-10-CM | POA: Diagnosis not present

## 2021-04-13 DIAGNOSIS — R296 Repeated falls: Secondary | ICD-10-CM | POA: Diagnosis not present

## 2021-04-13 DIAGNOSIS — R413 Other amnesia: Secondary | ICD-10-CM | POA: Diagnosis not present

## 2021-04-13 DIAGNOSIS — D72819 Decreased white blood cell count, unspecified: Secondary | ICD-10-CM | POA: Diagnosis not present

## 2021-04-13 DIAGNOSIS — D692 Other nonthrombocytopenic purpura: Secondary | ICD-10-CM | POA: Diagnosis not present

## 2021-04-13 DIAGNOSIS — I10 Essential (primary) hypertension: Secondary | ICD-10-CM | POA: Diagnosis not present

## 2021-04-13 DIAGNOSIS — M545 Low back pain, unspecified: Secondary | ICD-10-CM | POA: Diagnosis not present

## 2021-04-19 DIAGNOSIS — W19XXXD Unspecified fall, subsequent encounter: Secondary | ICD-10-CM | POA: Diagnosis not present

## 2021-04-19 DIAGNOSIS — R296 Repeated falls: Secondary | ICD-10-CM | POA: Diagnosis not present

## 2021-04-19 DIAGNOSIS — M1990 Unspecified osteoarthritis, unspecified site: Secondary | ICD-10-CM | POA: Diagnosis not present

## 2021-04-19 DIAGNOSIS — D72819 Decreased white blood cell count, unspecified: Secondary | ICD-10-CM | POA: Diagnosis not present

## 2021-04-19 DIAGNOSIS — F0390 Unspecified dementia without behavioral disturbance: Secondary | ICD-10-CM | POA: Diagnosis not present

## 2021-04-19 DIAGNOSIS — M545 Low back pain, unspecified: Secondary | ICD-10-CM | POA: Diagnosis not present

## 2021-04-19 DIAGNOSIS — R413 Other amnesia: Secondary | ICD-10-CM | POA: Diagnosis not present

## 2021-04-19 DIAGNOSIS — D692 Other nonthrombocytopenic purpura: Secondary | ICD-10-CM | POA: Diagnosis not present

## 2021-04-19 DIAGNOSIS — I10 Essential (primary) hypertension: Secondary | ICD-10-CM | POA: Diagnosis not present

## 2021-04-20 DIAGNOSIS — M545 Low back pain, unspecified: Secondary | ICD-10-CM | POA: Diagnosis not present

## 2021-04-20 DIAGNOSIS — I10 Essential (primary) hypertension: Secondary | ICD-10-CM | POA: Diagnosis not present

## 2021-04-20 DIAGNOSIS — R413 Other amnesia: Secondary | ICD-10-CM | POA: Diagnosis not present

## 2021-04-20 DIAGNOSIS — M1990 Unspecified osteoarthritis, unspecified site: Secondary | ICD-10-CM | POA: Diagnosis not present

## 2021-04-20 DIAGNOSIS — R296 Repeated falls: Secondary | ICD-10-CM | POA: Diagnosis not present

## 2021-04-20 DIAGNOSIS — D72819 Decreased white blood cell count, unspecified: Secondary | ICD-10-CM | POA: Diagnosis not present

## 2021-04-20 DIAGNOSIS — W19XXXD Unspecified fall, subsequent encounter: Secondary | ICD-10-CM | POA: Diagnosis not present

## 2021-04-20 DIAGNOSIS — F0390 Unspecified dementia without behavioral disturbance: Secondary | ICD-10-CM | POA: Diagnosis not present

## 2021-04-20 DIAGNOSIS — D692 Other nonthrombocytopenic purpura: Secondary | ICD-10-CM | POA: Diagnosis not present

## 2021-04-22 ENCOUNTER — Other Ambulatory Visit: Payer: Self-pay

## 2021-04-22 ENCOUNTER — Encounter: Payer: Self-pay | Admitting: Family Medicine

## 2021-04-22 ENCOUNTER — Ambulatory Visit (INDEPENDENT_AMBULATORY_CARE_PROVIDER_SITE_OTHER): Payer: Medicare PPO | Admitting: Family Medicine

## 2021-04-22 VITALS — BP 130/70 | HR 93 | Temp 98.0°F | Ht 64.0 in | Wt 169.2 lb

## 2021-04-22 DIAGNOSIS — I10 Essential (primary) hypertension: Secondary | ICD-10-CM | POA: Diagnosis not present

## 2021-04-22 DIAGNOSIS — W19XXXD Unspecified fall, subsequent encounter: Secondary | ICD-10-CM | POA: Diagnosis not present

## 2021-04-22 DIAGNOSIS — D692 Other nonthrombocytopenic purpura: Secondary | ICD-10-CM | POA: Diagnosis not present

## 2021-04-22 DIAGNOSIS — R296 Repeated falls: Secondary | ICD-10-CM | POA: Diagnosis not present

## 2021-04-22 DIAGNOSIS — S91115A Laceration without foreign body of left lesser toe(s) without damage to nail, initial encounter: Secondary | ICD-10-CM | POA: Diagnosis not present

## 2021-04-22 DIAGNOSIS — Z23 Encounter for immunization: Secondary | ICD-10-CM | POA: Diagnosis not present

## 2021-04-22 DIAGNOSIS — M545 Low back pain, unspecified: Secondary | ICD-10-CM | POA: Diagnosis not present

## 2021-04-22 DIAGNOSIS — F0390 Unspecified dementia without behavioral disturbance: Secondary | ICD-10-CM | POA: Diagnosis not present

## 2021-04-22 DIAGNOSIS — M1990 Unspecified osteoarthritis, unspecified site: Secondary | ICD-10-CM | POA: Diagnosis not present

## 2021-04-22 DIAGNOSIS — R413 Other amnesia: Secondary | ICD-10-CM | POA: Diagnosis not present

## 2021-04-22 DIAGNOSIS — D72819 Decreased white blood cell count, unspecified: Secondary | ICD-10-CM | POA: Diagnosis not present

## 2021-04-22 NOTE — Progress Notes (Signed)
Phone [phone] In person visit   Subjective:   Lori Ramirez is a 87 y.o. year old very pleasant female patient who presents for/with See problem oriented charting Chief Complaint  Patient presents with   Laceration    Patients husband cut her toe instead of her toenail.    This visit occurred during the SARS-CoV-2 public health emergency.  Safety protocols were in place, including screening questions prior to the visit, additional usage of staff PPE, and extensive cleaning of exam room while observing appropriate contact time as indicated for disinfecting solutions.   Past Medical History-  Patient Active Problem List   Diagnosis Date Noted   Memory loss 07/02/2019    Priority: 1.   Hyperlipidemia 07/12/2016    Priority: 2.   Essential hypertension 05/07/2015    Priority: 2.   Obesity (BMI 30-39.9) 05/07/2015    Priority: 2.   Low back pain 05/09/2017    Priority: 3.   Senile purpura (HCC) 01/17/2017    Priority: 3.   S/P left THA, AA 03/19/2013    Priority: 3.   Nonspecific pain in the lumbar region 06/04/2008    Priority: 3.   Moderate dementia without behavioral disturbance (HCC) 01/07/2021   Risk for falls 07/02/2019   Pain of both hip joints 06/14/2018    Medications- reviewed and updated Current Outpatient Medications  Medication Sig Dispense Refill   amLODipine (NORVASC) 2.5 MG tablet TAKE 1 TABLET BY MOUTH EVERY DAY 90 tablet 3   bisacodyl (DULCOLAX) 5 MG EC tablet Take 5 mg by mouth daily as needed for moderate constipation.     calcium gluconate 500 MG tablet Take 500 mg by mouth daily.     clindamycin (CLEOCIN) 300 MG capsule      Cyanocobalamin (VITAMIN B-12 PO) Take by mouth.     hydrochlorothiazide (HYDRODIURIL) 25 MG tablet TAKE 1 TABLET BY MOUTH EVERY DAY 90 tablet 3   rivastigmine (EXELON) 1.5 MG capsule Take 1 capsule (1.5 mg total) by mouth 2 (two) times daily. 60 capsule 11   VITAMIN D PO Take by mouth.     No current facility-administered  medications for this visit.     Objective:  BP (!) 188/85   Pulse 93   Temp 98 F (36.7 C) (Temporal)   Ht 5\' 4"  (1.626 m)   Wt 169 lb 3.2 oz (76.7 kg)   SpO2 97%   BMI 29.04 kg/m  Gen: NAD, resting comfortably CV: RRR no murmurs rubs or gallops Lungs: CTAB no crackles, wheeze, rhonchi Ext: no edema Skin: warm, dry, 1 cm laceration noted left 2nd toe- when dressing removed- regular bleeding noted- applied pressure for 20 minutes which allowed time for suture to be applied as below Neuro: walks with cane   Procedure Name: Laceration Repair  Location: left 2nd toe. Length 1 cm Pre-Procedure Diagnosis: Laceration Post-Procedure Diagnosis: Repaired Laceration Informed consent was obtained before procedure started. PROCEDURE: The area was prepped. Local anesthesia was achieved using  1 cc of  Lidocaine 1% without  epinephrine. The wound was copiously irrigated. A single 4-0 prolene interrupted suture were placed.  Estimated blood loss was less than 0.5 mL. A dressing was applied to the area and anticipatory guidance, as well as standard post-procedure care, was explained. Return precautions are given. The patient tolerated the procedure well without complications. Follow-up visit set for suture removal and evaluation of the laceration in 14 days    Assessment and Plan  # Laceration on the left 2nd  toe S: Patient reports today with a cut to her left 2nd toe. Husband states that he was trimming her toe nails when this happened- clipped about a 1 cm area open A/P: Laceration was repaired today with a single suture-was having ongoing issues with bleeding for suture was required-no issues after laceration repair.  Home care reviewed  #hypertension S: medication: Amlodipine 2.5 mg, hydrochlorothiazide 25 mg Home readings #s: Reports good home readings-this morning was 130/70 BP Readings from Last 3 Encounters:  04/22/21 (!) 188/85  03/05/21 125/75  01/07/21 (!) 162/79  A/P: Blood  pressure well controlled at home.  Elevated in office today in the 160s on my recheck-I think there is some anxiety with the recent laceration as well as tetanus shot-she will let me know if fails to go back down at home   Recommended follow up: Return in about 2 weeks (around 05/06/2021). Future Appointments  Date Time Provider Department Center  07/29/2021  9:20 AM Shelva Majestic, MD [facility]    Lab/Order associations:   ICD-10-CM   1. Laceration of second toe of left foot, initial encounter  S91.115A Td : Tetanus/diphtheria >7yo Preservative  free    2. Essential hypertension  I10      I,Harris Phan,acting as a scribe for Tana Conch, MD.,have documented all relevant documentation on the behalf of Tana Conch, MD,as directed by  Tana Conch, MD while in the presence of Tana Conch, MD.  I, Tana Conch, MD, have reviewed all documentation for this visit. The documentation on 04/22/21 for the exam, diagnosis, procedures, and orders are all accurate and complete.  Return precautions advised.  Tana Conch, MD

## 2021-04-22 NOTE — Patient Instructions (Addendum)
Team please give plain TD vaccination under left toe laceration.  Please monitor there sutured area- mild puffiness/swelling is to be expected. If there are any signs of pus, excess bleeding, or infection or increasing painplease let me know.  Avoid taking a shower for at least 24 hours from repair. After that please only clean the area with only soap and water and pat dry.  Recommended follow up: Return in about 2 weeks (around 05/06/2021). For suture removal

## 2021-04-27 ENCOUNTER — Encounter: Payer: Medicare PPO | Admitting: Psychology

## 2021-04-27 DIAGNOSIS — M1990 Unspecified osteoarthritis, unspecified site: Secondary | ICD-10-CM | POA: Diagnosis not present

## 2021-04-27 DIAGNOSIS — W19XXXD Unspecified fall, subsequent encounter: Secondary | ICD-10-CM | POA: Diagnosis not present

## 2021-04-27 DIAGNOSIS — R413 Other amnesia: Secondary | ICD-10-CM | POA: Diagnosis not present

## 2021-04-27 DIAGNOSIS — I10 Essential (primary) hypertension: Secondary | ICD-10-CM | POA: Diagnosis not present

## 2021-04-27 DIAGNOSIS — F0390 Unspecified dementia without behavioral disturbance: Secondary | ICD-10-CM | POA: Diagnosis not present

## 2021-04-27 DIAGNOSIS — M545 Low back pain, unspecified: Secondary | ICD-10-CM | POA: Diagnosis not present

## 2021-04-27 DIAGNOSIS — D72819 Decreased white blood cell count, unspecified: Secondary | ICD-10-CM | POA: Diagnosis not present

## 2021-04-27 DIAGNOSIS — R296 Repeated falls: Secondary | ICD-10-CM | POA: Diagnosis not present

## 2021-04-27 DIAGNOSIS — D692 Other nonthrombocytopenic purpura: Secondary | ICD-10-CM | POA: Diagnosis not present

## 2021-05-06 ENCOUNTER — Other Ambulatory Visit: Payer: Self-pay

## 2021-05-06 ENCOUNTER — Encounter: Payer: Self-pay | Admitting: Physician Assistant

## 2021-05-06 ENCOUNTER — Ambulatory Visit: Payer: Medicare PPO | Admitting: Physician Assistant

## 2021-05-06 ENCOUNTER — Encounter: Payer: Medicare PPO | Admitting: Psychology

## 2021-05-06 VITALS — BP 166/80 | HR 84

## 2021-05-06 DIAGNOSIS — Z4802 Encounter for removal of sutures: Secondary | ICD-10-CM

## 2021-05-06 DIAGNOSIS — S91115D Laceration without foreign body of left lesser toe(s) without damage to nail, subsequent encounter: Secondary | ICD-10-CM

## 2021-05-06 NOTE — Progress Notes (Signed)
Lori Ramirez is a 87 y.o. female here for a follow up of a pre-existing problem.   History of Present Illness:   Chief Complaint  Patient presents with   Suture / Staple Removal    Stitch placed 9/29 on left foot 2nd toe    HPI  Suture removal Patient was seen by PCP Tana Conch for repair of laceration of second toe of left foot on 04/22/2021.Received a single patient received a single suture during that visit.  Her husband has been changing the bandage regularly.  Patient and husband deny: Fever, chills, discharge from lesion, pain, swelling, redness.   Past Medical History:  Diagnosis Date   Arthritis    Essential hypertension 05/07/2015   Obesity (BMI 30-39.9) 05/07/2015   Osteoarthritis of left hip      Social History   Tobacco Use   Smoking status: Never   Smokeless tobacco: Never  Vaping Use   Vaping Use: Never used  Substance Use Topics   Alcohol use: Yes    Alcohol/week: 14.0 standard drinks    Types: 14 Glasses of wine per week    Comment: 1-2 glasses of wine daily    Drug use: No    Past Surgical History:  Procedure Laterality Date   ABDOMINAL HYSTERECTOMY  1977   TONSILLECTOMY AND ADENOIDECTOMY  1939   TOTAL HIP ARTHROPLASTY Left 03/19/2013   Procedure: LEFT TOTAL HIP ARTHROPLASTY ANTERIOR APPROACH;  Surgeon: [name], MD;  Location: [facility];  Service: Orthopedics;  Laterality: Left;    Family History  Problem Relation Age of Onset   Other Mother        infection of unknown site led to death- 70s   Other Father        lived to 100   Healthy Brother     Allergies  Allergen Reactions   Amoxicillin    Aricept [Donepezil]     Severe debilitating chills   Cortisone Acetate     REACTION: rash   Iodine     REACTION: rash   Prednisone     REACTION: rash   Triamcinolone Acetonide     REACTION: rash    Current Medications:   Current Outpatient Medications:    amLODipine (NORVASC) 2.5 MG tablet, TAKE 1 TABLET BY MOUTH EVERY DAY, Disp:  90 tablet, Rfl: 3   bisacodyl (DULCOLAX) 5 MG EC tablet, Take 5 mg by mouth daily as needed for moderate constipation., Disp: , Rfl:    calcium gluconate 500 MG tablet, Take 500 mg by mouth daily., Disp: , Rfl:    clindamycin (CLEOCIN) 300 MG capsule, , Disp: , Rfl:    Cyanocobalamin (VITAMIN B-12 PO), Take by mouth., Disp: , Rfl:    hydrochlorothiazide (HYDRODIURIL) 25 MG tablet, TAKE 1 TABLET BY MOUTH EVERY DAY, Disp: 90 tablet, Rfl: 3   rivastigmine (EXELON) 1.5 MG capsule, Take 1 capsule (1.5 mg total) by mouth 2 (two) times daily., Disp: 60 capsule, Rfl: 11   VITAMIN D PO, Take by mouth., Disp: , Rfl:    Review of Systems:   ROS Negative unless otherwise specified per HPI.  Vitals:   Vitals:   05/06/21 1016  BP: (!) 166/80  Pulse: 84  SpO2: 97%     There is no height or weight on file to calculate BMI.  Physical Exam:   Physical Exam Constitutional:      Appearance: Normal appearance. She is well-developed.  HENT:     Head: Normocephalic and atraumatic.  Eyes:  General: Lids are normal.     Extraocular Movements: Extraocular movements intact.     Conjunctiva/sclera: Conjunctivae normal.  Pulmonary:     Effort: Pulmonary effort is normal.  Musculoskeletal:        General: Normal range of motion.     Cervical back: Normal range of motion and neck supple.  Skin:    General: Skin is warm and dry.     Comments: Well-healed laceration to tip of second toe with single suture in place  Neurological:     Mental Status: She is alert and oriented to person, place, and time.  Psychiatric:        Attention and Perception: Attention and perception normal.        Mood and Affect: Mood normal.        Behavior: Behavior normal.        Thought Content: Thought content normal.        Judgment: Judgment normal.   Suture removal Area was cleaned with alcohol swab Suture was removed with forceps and iris scissors Patient tolerated well No evidence of infection  Assessment  and Plan:   Laceration of second toe of left foot, subsequent encounter; Visit for suture removal Area is healing well without signs of infection Recommended keeping area loosely bandaged when walking prolonged periods of time Use plain soap and water to keep area clean Follow-up if any concerns  Jarold Motto, PA-C

## 2021-05-27 ENCOUNTER — Ambulatory Visit: Payer: Medicare PPO | Admitting: Physician Assistant

## 2021-07-29 ENCOUNTER — Ambulatory Visit (INDEPENDENT_AMBULATORY_CARE_PROVIDER_SITE_OTHER): Payer: Medicare PPO | Admitting: Family Medicine

## 2021-07-29 ENCOUNTER — Other Ambulatory Visit: Payer: Self-pay

## 2021-07-29 ENCOUNTER — Encounter: Payer: Self-pay | Admitting: Family Medicine

## 2021-07-29 VITALS — BP 132/70 | HR 82 | Temp 98.3°F | Ht 64.0 in | Wt 166.0 lb

## 2021-07-29 DIAGNOSIS — Z0000 Encounter for general adult medical examination without abnormal findings: Secondary | ICD-10-CM

## 2021-07-29 DIAGNOSIS — E785 Hyperlipidemia, unspecified: Secondary | ICD-10-CM | POA: Diagnosis not present

## 2021-07-29 DIAGNOSIS — D692 Other nonthrombocytopenic purpura: Secondary | ICD-10-CM

## 2021-07-29 DIAGNOSIS — F03B0 Unspecified dementia, moderate, without behavioral disturbance, psychotic disturbance, mood disturbance, and anxiety: Secondary | ICD-10-CM | POA: Diagnosis not present

## 2021-07-29 DIAGNOSIS — I10 Essential (primary) hypertension: Secondary | ICD-10-CM | POA: Diagnosis not present

## 2021-07-29 LAB — CBC WITH DIFFERENTIAL/PLATELET
Basophils Absolute: 0 10*3/uL (ref 0.0–0.1)
Basophils Relative: 0.7 % (ref 0.0–3.0)
Eosinophils Absolute: 0 10*3/uL (ref 0.0–0.7)
Eosinophils Relative: 1.1 % (ref 0.0–5.0)
HCT: 41.4 % (ref 36.0–46.0)
Hemoglobin: 14.1 g/dL (ref 12.0–15.0)
Lymphocytes Relative: 16.8 % (ref 12.0–46.0)
Lymphs Abs: 0.7 10*3/uL (ref 0.7–4.0)
MCHC: 34.2 g/dL (ref 30.0–36.0)
MCV: 98.4 fl (ref 78.0–100.0)
Monocytes Absolute: 0.5 10*3/uL (ref 0.1–1.0)
Monocytes Relative: 11.9 % (ref 3.0–12.0)
Neutro Abs: 2.7 10*3/uL (ref 1.4–7.7)
Neutrophils Relative %: 69.5 % (ref 43.0–77.0)
Platelets: 183 10*3/uL (ref 150.0–400.0)
RBC: 4.21 Mil/uL (ref 3.87–5.11)
RDW: 13.2 % (ref 11.5–15.5)
WBC: 3.9 10*3/uL — ABNORMAL LOW (ref 4.0–10.5)

## 2021-07-29 LAB — COMPREHENSIVE METABOLIC PANEL
ALT: 14 U/L (ref 0–35)
AST: 20 U/L (ref 0–37)
Albumin: 4.4 g/dL (ref 3.5–5.2)
Alkaline Phosphatase: 52 U/L (ref 39–117)
BUN: 27 mg/dL — ABNORMAL HIGH (ref 6–23)
CO2: 31 mEq/L (ref 19–32)
Calcium: 10.1 mg/dL (ref 8.4–10.5)
Chloride: 97 mEq/L (ref 96–112)
Creatinine, Ser: 1 mg/dL (ref 0.40–1.20)
GFR: 50.45 mL/min — ABNORMAL LOW (ref >60.00)
Glucose, Bld: 96 mg/dL (ref 70–99)
Potassium: 4.5 mEq/L (ref 3.5–5.1)
Sodium: 135 mEq/L (ref 135–145)
Total Bilirubin: 0.8 mg/dL (ref 0.2–1.2)
Total Protein: 7 g/dL (ref 6.0–8.3)

## 2021-07-29 LAB — LIPID PANEL
Cholesterol: 277 mg/dL — ABNORMAL HIGH (ref 0–200)
HDL: 122.6 mg/dL (ref >39.00)
LDL Cholesterol: 138 mg/dL — ABNORMAL HIGH (ref 0–99)
NonHDL: 154.54
Total CHOL/HDL Ratio: 2
Triglycerides: 81 mg/dL (ref 0.0–149.0)
VLDL: 16.2 mg/dL (ref 0.0–40.0)

## 2021-07-29 MED ORDER — RIVASTIGMINE TARTRATE 1.5 MG PO CAPS: 1.5000 mg | ORAL_CAPSULE | Freq: Two times a day (BID) | ORAL | 11 refills | Status: DC

## 2021-07-29 NOTE — Progress Notes (Signed)
Phone [phone]   Subjective:  Patient presents today for their annual physical. Chief complaint-noted.   See problem oriented charting- ROS- full  review of systems was completed and negative except for: right leg wound (intermittent ulcerations- has seen dermatology and has been told ruptured blood vessels- slowly heal over time- current one is currently healing)   The following were reviewed and entered/updated in epic: Past Medical History:  Diagnosis Date   Arthritis    Essential hypertension 05/07/2015   Obesity (BMI 30-39.9) 05/07/2015   Osteoarthritis of left hip    Patient Active Problem List   Diagnosis Date Noted   Moderate dementia without behavioral disturbance 01/07/2021    Priority: High   Risk for falls 07/02/2019    Priority: Medium    Hyperlipidemia 07/12/2016    Priority: Medium    Essential hypertension 05/07/2015    Priority: Medium    Obesity (BMI 30-39.9) 05/07/2015    Priority: Medium    Low back pain 05/09/2017    Priority: Low   Senile purpura ([place]) 01/17/2017    Priority: Low   S/P left THA, AA 03/19/2013    Priority: Low   Nonspecific pain in the lumbar region 06/04/2008    Priority: Low   Pain of both hip joints 06/14/2018   Past Surgical History:  Procedure Laterality Date   ABDOMINAL HYSTERECTOMY  1977   TONSILLECTOMY AND ADENOIDECTOMY  1939   TOTAL HIP ARTHROPLASTY Left 03/19/2013   Procedure: LEFT TOTAL HIP ARTHROPLASTY ANTERIOR APPROACH;  Surgeon: Mauri Pole, MD;  Location: [facility];  Service: Orthopedics;  Laterality: Left;    Family History  Problem Relation Age of Onset   Other Mother        infection of unknown site led to death- 72s   Other Father        lived to 66   Healthy Brother     Medications- reviewed and updated Current Outpatient Medications  Medication Sig Dispense Refill   amLODipine (NORVASC) 2.5 MG tablet TAKE 1 TABLET BY MOUTH EVERY DAY 90 tablet 3   bisacodyl (DULCOLAX) 5 MG EC tablet Take 5 mg by  mouth daily as needed for moderate constipation.     calcium gluconate 500 MG tablet Take 500 mg by mouth daily.     clindamycin (CLEOCIN) 300 MG capsule      Cyanocobalamin (VITAMIN B-12 PO) Take by mouth.     hydrochlorothiazide (HYDRODIURIL) 25 MG tablet TAKE 1 TABLET BY MOUTH EVERY DAY 90 tablet 3   VITAMIN D PO Take by mouth.     rivastigmine (EXELON) 1.5 MG capsule Take 1 capsule (1.5 mg total) by mouth 2 (two) times daily. 60 capsule 11   No current facility-administered medications for this visit.    Allergies-reviewed and updated Allergies  Allergen Reactions   Amoxicillin    Aricept [Donepezil]     Severe debilitating chills   Cortisone Acetate     REACTION: rash   Iodine     REACTION: rash   Prednisone     REACTION: rash   Triamcinolone Acetonide     REACTION: rash    Social History   Social History Narrative   Married Psychologist, forensic- lives with husband who is Dr. Yong Channel ptaient. . 2 daughters. 2 grandsons. 2 greatgrandchildren in 2020.       Retired, was a Geologist, engineering Situation: lives with husband      Spiritual Beliefs: protestant      Hobbies: golfing, cruises (  outside of covid 19)      Right handed   Objective  Objective:  BP 132/70 Comment: most recent home reading   Pulse 82    Temp 98.3 F (36.8 C)    Ht 5\' 4"  (1.626 m)    Wt 166 lb (75.3 kg)    SpO2 96%    BMI 28.49 kg/m  Gen: NAD, resting comfortably HEENT: Mucous membranes are moist. Oropharynx normal Neck: no thyromegaly CV: RRR no murmurs rubs or gallops Lungs: CTAB no crackles, wheeze, rhonchi Abdomen: soft/nontender/nondistended/normal bowel sounds. No rebound or guarding.  Ext: trace edema, healing 1 cm wound on right lower extremity Skin: warm, dry Neuro: grossly normal, moves all extremities, PERRLA   Assessment and Plan   88 y.o. female presenting for annual physical.  Health Maintenance counseling: 1. Anticipatory guidance: Patient counseled regarding regular dental exams  -q23months, eye exams - declining - have advised to follow up,  avoiding smoking and second hand smoke , limiting alcohol to 1 beverage per day- with memory loss would avoid completely - she does do wine with dinner and helps with quality of life , no illicit drugs.   2. Risk factor reduction:  Advised patient of need for regular exercise and diet rich and fruits and vegetables to reduce risk of heart attack and stroke.  Exercise- doing PT exercises at home- trying to prevent falls- doing much better Diet/weight management-has improved diet-  down 8 lbs from last year Wt Readings from Last 3 Encounters:  07/29/21 166 lb (75.3 kg)  04/22/21 169 lb 3.2 oz (76.7 kg)  03/05/21 168 lb (76.2 kg)   3. Immunizations/screenings/ancillary studies-  Prevnar 20 today-final pneumonia shot based on current guidelines- she declines   Recommend COVID-19 vaccination at the pharmacy  Please check with your pharmacy to see if they have the shingrix vaccine. If they do- please get this immunization and update [place] by phone call or mychart with dates you receive the vaccine - she declines for now Immunization History  Administered Date(s) Administered   PFIZER(Purple Top)SARS-COV-2 Vaccination 08/19/2019, 09/09/2019, 05/04/2020   Pneumococcal Polysaccharide-23 06/04/2008   Td 04/22/2021   4. Cervical cancer screening-  past age based screning recommendations. Hysterectomy for benign reasons 5. Breast cancer screening-past age based screening recommendations-no longer gets mammograms 6. Colon cancer screening - past age based screening recommendations 7. Skin cancer screening- [place] derm as needed- leg wounds at times. advised regular sunscreen use. Denies worrisome, changing, or new skin lesions.  8. Birth control/STD check-  postmenopausal and monogamous 9. Osteoporosis screening at 65- no bone density on file-offered today anddeclines 10. Smoking associated screening - Never smoker  Status of chronic or acute  concerns   #Moderate dementia-likely due to Alzheimer's disease without behavioral disturbance- S: MMSE 26 out of 30 on July 02, 2019 but later had progressive decline- referred to Marzetta Board, PA-C with neurology. Findings were highly suspicious for dementia, possibly due to Alzheimer's disease  Medication: Rivastigmine/exelon 1.5 mg twice daily started July 2022 A/P: memory with continued gradual decline. Does not want to return to neurology and asks me to prescribe rivastigmine- will fill under my name   #hypertension S: medication: HCTZ 25Mg , Amlodipine 2.5Mg   Home readings #s: 132/70 most recently at home- first BP here 160/70 and trending down to 142/72 before discharge BP Readings from Last 3 Encounters:  07/29/21 132/70  05/06/21 (!) 166/80  04/22/21 130/70  A/P: Controlled. Continue current medications.   #hyperlipidemia-statin not indicated for primary prevention S:  Medication:None Lab Results  Component Value Date   CHOL 261 (H) 07/02/2020   HDL 119 07/02/2020   LDLCALC 127 (H) 07/02/2020   LDLDIRECT 120.5 12/24/2012   TRIG 55 07/02/2020   CHOLHDL 2.2 07/02/2020   A/P: will update lipids- has done a good job on weight loss- hopefully mildly improved- want to avoid additional meds  #Leukopenia-stable mild leukopenia over the years and other cell lines reassuring.  Update CBC with blood work today - fine last check  Lab Results  Component Value Date   WBC 4.1 07/02/2020   HGB 13.7 07/02/2020   HCT 39.5 07/02/2020   MCV 96.3 07/02/2020   PLT 201 07/02/2020   #senile purpura- noted. Stable. Check cbc at least annually  Recommended follow up: Return in about 6 months (around 01/26/2022) for follow up- or sooner if needed.  Lab/Order associations: Not fasting   ICD-10-CM   1. Preventative health care  Z00.00     2. Senile purpura (HCC) Chronic D69.2     3. Moderate dementia without behavioral disturbance  F03.B0     4. Hyperlipidemia, unspecified hyperlipidemia  type  E78.5 CBC with Differential/Platelet    Comprehensive metabolic panel    Lipid panel    5. Essential hypertension  I10       Meds ordered this encounter  Medications   rivastigmine (EXELON) 1.5 MG capsule    Sig: Take 1 capsule (1.5 mg total) by mouth 2 (two) times daily.    Dispense:  60 capsule    Refill:  11    Return precautions advised.  Garret Reddish, MD

## 2021-07-29 NOTE — Patient Instructions (Addendum)
°  Prevnar 20 today-final pneumonia shot based on current guidelines   Recommend COVID-19 vaccination at the pharmacy  Please check with your pharmacy to see if they have the shingrix vaccine. If they do- please get this immunization and update [place] by phone call or mychart with dates you receive the vaccine  Really want you to see eye doctor annually  Please stop by lab before you go If you have mychart- we will send your results within 3 business days of [place] receiving them.  If you do not have mychart- we will call you about results within 5 business days of [place] receiving them.  *please also note that you will see labs on mychart as soon as they post. I will later go in and write notes on them- will say "notes from Dr. Clayborn Heron memory loss ideally would avoid completely-with memory loss would avoid completely discussed chair exercises once again this year follows with Dr. Ubaldo Glassing as needed we will consider dermatology.  Follows with Dr. Ubaldo Glassing as needed  Recommended follow up: No follow-ups on file.

## 2021-09-22 ENCOUNTER — Other Ambulatory Visit: Payer: Self-pay

## 2021-09-22 ENCOUNTER — Ambulatory Visit: Payer: Medicare PPO | Admitting: Family Medicine

## 2021-09-22 ENCOUNTER — Encounter: Payer: Self-pay | Admitting: Family Medicine

## 2021-09-22 VITALS — BP 160/90 | HR 79 | Temp 97.9°F | Ht 64.0 in | Wt 165.5 lb

## 2021-09-22 DIAGNOSIS — W19XXXA Unspecified fall, initial encounter: Secondary | ICD-10-CM

## 2021-09-22 DIAGNOSIS — S51011A Laceration without foreign body of right elbow, initial encounter: Secondary | ICD-10-CM | POA: Diagnosis not present

## 2021-09-22 NOTE — Progress Notes (Signed)
? ?Subjective:  ? ? ? Patient ID: Lori Ramirez, female    DOB: Jun 02, 1931, 88 y.o.   MRN: [id number] ? ?Chief Complaint  ?Patient presents with  ? Fall  ?  Fell last night, has a place on right elbow  ? ? ?HPI-here w/husb ?Bps 125/70 ?[place] Seat last pm-was on way to bathroom.  Was in hallway may have slipped on rug-was using her rollator.  Has fallen in past.  Pt fell in [place] and rollator went down basement stairs.  No LOC. Neighbors helped get her up ?Lacerations R elbow-hused band-aids- still bleeding.  ? ?Health Maintenance Due  ?Topic Date Due  ? Zoster Vaccines- Shingrix (1 of 2) Never done  ? Pneumonia Vaccine 42+ Years old (2 - PCV) 06/04/2009  ? ? ?Past Medical History:  ?Diagnosis Date  ? Arthritis   ? Essential hypertension 05/07/2015  ? Obesity (BMI 30-39.9) 05/07/2015  ? Osteoarthritis of left hip   ? ? ?Past Surgical History:  ?Procedure Laterality Date  ? ABDOMINAL HYSTERECTOMY  1977  ? TONSILLECTOMY AND ADENOIDECTOMY  1939  ? TOTAL HIP ARTHROPLASTY Left 03/19/2013  ? Procedure: LEFT TOTAL HIP ARTHROPLASTY ANTERIOR APPROACH;  Surgeon: [name], MD;  Location: [facility];  Service: Orthopedics;  Laterality: Left;  ? ? ?Outpatient Medications Prior to Visit  ?Medication Sig Dispense Refill  ? amLODipine (NORVASC) 2.5 MG tablet TAKE 1 TABLET BY MOUTH EVERY DAY 90 tablet 3  ? bisacodyl (DULCOLAX) 5 MG EC tablet Take 5 mg by mouth daily as needed for moderate constipation.    ? calcium gluconate 500 MG tablet Take 500 mg by mouth daily.    ? clindamycin (CLEOCIN) 300 MG capsule     ? Cyanocobalamin (VITAMIN B-12 PO) Take by mouth.    ? hydrochlorothiazide (HYDRODIURIL) 25 MG tablet TAKE 1 TABLET BY MOUTH EVERY DAY 90 tablet 3  ? rivastigmine (EXELON) 1.5 MG capsule Take 1 capsule (1.5 mg total) by mouth 2 (two) times daily. 60 capsule 11  ? VITAMIN D PO Take by mouth.    ? ?No facility-administered medications prior to visit.  ? ? ?Allergies  ?Allergen Reactions  ? Amoxicillin   ? Aricept [Donepezil]   ?   Severe debilitating chills  ? Cortisone Acetate   ?  REACTION: rash  ? Iodine   ?  REACTION: rash  ? Prednisone   ?  REACTION: rash  ? Triamcinolone Acetonide   ?  REACTION: rash  ? ?ROS neg/noncontributory except as noted HPI/below ? ? ?   ?Objective:  ?  ? ?BP (!) 150/90   Pulse 79   Temp 97.9 ?F (36.6 ?C) (Temporal)   Ht 5\' 4"  (1.626 m)   Wt 165 lb 8 oz (75.1 kg)   SpO2 97%   BMI 28.41 kg/m?  ?Wt Readings from Last 3 Encounters:  ?09/22/21 165 lb 8 oz (75.1 kg)  ?07/29/21 166 lb (75.3 kg)  ?04/22/21 169 lb 3.2 oz (76.7 kg)  ? ? ?   ? ?Gen: [facility] NAD WF elderly  repeat bp 160/90 ?HEENT: NCAT, conjunctiva not injected, sclera nonicteric ?NECK:  supple, no thyromegaly, no nodes, no carotid bruits ?CARDIAC: RRR, S1S2+, no murmur. DP1+B ?LUNGS: CTAB. No wheezes ?EXT:  tr edema ?MSK: limping gait.  Using cane ?NEURO: A&O x3.  CN II-XII intact.  ?PSYCH: normal mood. Good eye contact ?Head-no lesions ?R elbow-good rom.  No TTP.  Superficial skin tear inferior to elbow approx 2-3cm.  Some old blood.  No acute  bleeding.  Some other superficial abrasions as well more laterally.  +bruising ? ?Dressed w/bacitracin, telfa and coban. ?Td done 04/22/2021 ? ?Assessment & Plan:  ? ?Problem List Items Addressed This Visit   ?None ?Visit Diagnoses   ? ? Fall from standing, initial encounter    -  Primary  ? Skin tear of right elbow without complication, initial encounter      ? ?  ? Fall from standing-safety measures discussed.  Uses walker and dose exercises ?Skin tear R elbow area-dressed.  Change drsg daily.  Td done 04/22/21.  Watch for s/s infection ? ?No orders of the defined types were placed in this encounter. ? ? ?Wellington Hampshire, MD ? ?

## 2021-09-22 NOTE — Patient Instructions (Signed)
It was very nice to see you today! ? ?Tetanus is up to date ?Change dressing daily.  More often if needed ?Don't use peroxide on wound ? ? ?Remove rugs as you've done.  Maybe baby gate over opening to basement ?

## 2021-10-10 ENCOUNTER — Encounter: Payer: Self-pay | Admitting: Family Medicine

## 2021-12-29 ENCOUNTER — Other Ambulatory Visit: Payer: Self-pay | Admitting: Family Medicine

## 2022-02-01 ENCOUNTER — Ambulatory Visit: Payer: Medicare PPO | Admitting: Family Medicine

## 2022-02-01 ENCOUNTER — Encounter: Payer: Self-pay | Admitting: Family Medicine

## 2022-02-01 VITALS — BP 134/70 | HR 73 | Temp 97.9°F | Ht 64.0 in | Wt 151.8 lb

## 2022-02-01 DIAGNOSIS — R32 Unspecified urinary incontinence: Secondary | ICD-10-CM | POA: Diagnosis not present

## 2022-02-01 DIAGNOSIS — E785 Hyperlipidemia, unspecified: Secondary | ICD-10-CM | POA: Diagnosis not present

## 2022-02-01 DIAGNOSIS — I10 Essential (primary) hypertension: Secondary | ICD-10-CM | POA: Diagnosis not present

## 2022-02-01 LAB — POC URINALSYSI DIPSTICK (AUTOMATED)
Bilirubin, UA: NEGATIVE
Blood, UA: POSITIVE
Glucose, UA: NEGATIVE
Ketones, UA: NEGATIVE
Nitrite, UA: POSITIVE
Protein, UA: NEGATIVE
Spec Grav, UA: 1.01 (ref 1.010–1.025)
Urobilinogen, UA: 0.2 E.U./dL
pH, UA: 6.5 (ref 5.0–8.0)

## 2022-02-01 LAB — CBC WITH DIFFERENTIAL/PLATELET
Basophils Absolute: 0 10*3/uL (ref 0.0–0.1)
Basophils Relative: 0.6 % (ref 0.0–3.0)
Eosinophils Absolute: 0.1 10*3/uL (ref 0.0–0.7)
Eosinophils Relative: 1.3 % (ref 0.0–5.0)
HCT: 40.1 % (ref 36.0–46.0)
Hemoglobin: 13.7 g/dL (ref 12.0–15.0)
Lymphocytes Relative: 12.6 % (ref 12.0–46.0)
Lymphs Abs: 0.6 10*3/uL — ABNORMAL LOW (ref 0.7–4.0)
MCHC: 34.1 g/dL (ref 30.0–36.0)
MCV: 91.9 fl (ref 78.0–100.0)
Monocytes Absolute: 0.5 10*3/uL (ref 0.1–1.0)
Monocytes Relative: 10 % (ref 3.0–12.0)
Neutro Abs: 3.8 10*3/uL (ref 1.4–7.7)
Neutrophils Relative %: 75.5 % (ref 43.0–77.0)
Platelets: 184 10*3/uL (ref 150.0–400.0)
RBC: 4.36 Mil/uL (ref 3.87–5.11)
RDW: 14.3 % (ref 11.5–15.5)
WBC: 5 10*3/uL (ref 4.0–10.5)

## 2022-02-01 LAB — COMPREHENSIVE METABOLIC PANEL
ALT: 12 U/L (ref 0–35)
AST: 19 U/L (ref 0–37)
Albumin: 4.3 g/dL (ref 3.5–5.2)
Alkaline Phosphatase: 51 U/L (ref 39–117)
BUN: 29 mg/dL — ABNORMAL HIGH (ref 6–23)
CO2: 30 mEq/L (ref 19–32)
Calcium: 10.4 mg/dL (ref 8.4–10.5)
Chloride: 101 mEq/L (ref 96–112)
Creatinine, Ser: 1.03 mg/dL (ref 0.40–1.20)
GFR: 48.51 mL/min — ABNORMAL LOW (ref >60.00)
Glucose, Bld: 94 mg/dL (ref 70–99)
Potassium: 5 mEq/L (ref 3.5–5.1)
Sodium: 138 mEq/L (ref 135–145)
Total Bilirubin: 0.7 mg/dL (ref 0.2–1.2)
Total Protein: 6.9 g/dL (ref 6.0–8.3)

## 2022-02-01 NOTE — Patient Instructions (Addendum)
Please stop by lab before you go- if you can pee today blood and urine- if not then can drop urine back off If you have mychart- we will send your results within 3 business days of [place] receiving them.  If you do not have mychart- we will call you about results within 5 business days of [place] receiving them.  *please also note that you will see labs on mychart as soon as they post. I will later go in and write notes on them- will say "notes from Dr. Durene Cal"   No changes today unless labs lead [place] to make changes  Recommended follow up: Return in about 6 months (around 08/04/2022) for physical or sooner if needed.Schedule b4 you leave.

## 2022-02-01 NOTE — Progress Notes (Signed)
Phone [phone] In person visit   Subjective:   Lori Ramirez is a 88 y.o. year old very pleasant female patient who presents for/with See problem oriented charting Chief Complaint  Patient presents with   Follow-up   Hypertension    Past Medical History-  Patient Active Problem List   Diagnosis Date Noted   Moderate dementia without behavioral disturbance 01/07/2021    Priority: High   Risk for falls 07/02/2019    Priority: Medium    Hyperlipidemia 07/12/2016    Priority: Medium    Essential hypertension 05/07/2015    Priority: Medium    Obesity (BMI 30-39.9) 05/07/2015    Priority: Medium    Low back pain 05/09/2017    Priority: Low   Senile purpura ([facility]) 01/17/2017    Priority: Low   S/P left THA, AA 03/19/2013    Priority: Low   Nonspecific pain in the lumbar region 06/04/2008    Priority: Low   Pain of both hip joints 06/14/2018    Medications- reviewed and updated Current Outpatient Medications  Medication Sig Dispense Refill   amLODipine (NORVASC) 2.5 MG tablet TAKE 1 TABLET BY MOUTH EVERY DAY 90 tablet 3   bisacodyl (DULCOLAX) 5 MG EC tablet Take 5 mg by mouth daily as needed for moderate constipation.     calcium gluconate 500 MG tablet Take 500 mg by mouth daily.     clindamycin (CLEOCIN) 300 MG capsule      Cyanocobalamin (VITAMIN B-12 PO) Take by mouth.     hydrochlorothiazide (HYDRODIURIL) 25 MG tablet TAKE 1 TABLET BY MOUTH EVERY DAY 90 tablet 3   rivastigmine (EXELON) 1.5 MG capsule Take 1 capsule (1.5 mg total) by mouth 2 (two) times daily. 60 capsule 11   VITAMIN D PO Take by mouth.     No current facility-administered medications for this visit.     Objective:  BP 134/70   Pulse 73   Temp 97.9 F (36.6 C)   Ht 5\' 4"  (1.626 m)   Wt 151 lb 12.8 oz (68.9 kg)   SpO2 96%   BMI 26.06 kg/m  Gen: NAD, resting comfortably CV: RRR no murmurs rubs or gallops Lungs: CTAB no crackles, wheeze, rhonchi Abdomen: soft/nontender/nondistended/normal  bowel sounds. No rebound or guarding.  Ext: trace edema Skin: warm, dry    Assessment and Plan   #Moderate dementia-likely due to Alzheimer's disease without behavioral disturbance S: MMSE 26 out of 30 on July 02, 2019 but later had progressive decline- referred to July 04, 2019, PA-C with neurology. Findings were highly suspicious for dementia, possibly due to Alzheimer's disease  Medication: Rivastigmine/exelon 1.5 mg twice daily started July 2022 A/P: Ongoing issue/mild decline-patient does not want to return to neurology at this point-we will continue to prescribe medication  #hypertension S: medication: HCTZ 25Mg - half tablet now as was having some urinary issues (wears liners to help with leakage) Amlodipine 2.5Mg   Home readings #s: low 130s/70s BP Readings from Last 3 Encounters:  02/01/22 134/70  09/22/21 (!) 160/90  07/29/21 132/70   A/P: blood pressure is ok on repeat and has been controlled at home- continue current medicine  #urinary incontinence- has had to wear pads for at least 2 months- check UA and culture. At least 3 months at night. No clear trigger that she can tell  #hyperlipidemia-statin not indicated for primary prevention S: Medication:None Lab Results  Component Value Date   CHOL 277 (H) 07/29/2021   HDL 122.60 07/29/2021   LDLCALC 138 (H)  07/29/2021   LDLDIRECT 120.5 12/24/2012   TRIG 81.0 07/29/2021   CHOLHDL 2 07/29/2021  A/P: remain off meds for primary prevention- focus on healthy eating/regular exercise   #Leukopenia-stable mild leukopenia over the years and other cell lines reassuring.-Very mild we will continue to trend Lab Results  Component Value Date   WBC 3.9 (L) 07/29/2021   HGB 14.1 07/29/2021   HCT 41.4 07/29/2021   MCV 98.4 07/29/2021   PLT 183.0 07/29/2021     Recommended follow up: Return in about 6 months (around 08/04/2022) for physical or sooner if needed.Schedule b4 you leave.   Lab/Order associations: NOT fasting    ICD-10-CM   1. Urinary incontinence, unspecified type  R32 POCT Urinalysis Dipstick (Automated)    Urine Culture    2. Essential hypertension  I10 CBC with Differential/Platelet    Comprehensive metabolic panel    3. Hyperlipidemia, unspecified hyperlipidemia type  E78.5 CBC with Differential/Platelet    Comprehensive metabolic panel      No orders of the defined types were placed in this encounter.   Return precautions advised.  Tana Conch, MD

## 2022-02-04 LAB — URINE CULTURE
MICRO NUMBER:: 13631011
SPECIMEN QUALITY:: ADEQUATE

## 2022-02-04 MED ORDER — SULFAMETHOXAZOLE-TRIMETHOPRIM 800-160 MG PO TABS: 1.0000 | ORAL_TABLET | Freq: Two times a day (BID) | ORAL | 0 refills | Status: DC

## 2022-02-04 NOTE — Addendum Note (Signed)
Addended by: Shelva Majestic on: 02/04/2022 09:35 AM   Modules accepted: Orders

## 2022-03-05 ENCOUNTER — Other Ambulatory Visit: Payer: Self-pay | Admitting: Family Medicine

## 2022-04-18 ENCOUNTER — Encounter: Payer: Self-pay | Admitting: *Deleted

## 2022-07-07 ENCOUNTER — Encounter: Payer: Self-pay | Admitting: *Deleted

## 2022-08-02 ENCOUNTER — Encounter: Payer: Self-pay | Admitting: Family Medicine

## 2022-08-03 NOTE — Telephone Encounter (Signed)
.  Type of form received: Handicap Placard Form  Additional comments:   Received by: Adonis Brook  Form should be Faxed to:  Form should be mailed to:    Is patient requesting call for pickup: yes   Form placed:  In provider's box  Attach charge sheet. yes  Individual made aware of 3-5 business day turn around (Y/N)?  Yes

## 2022-08-16 ENCOUNTER — Encounter: Payer: Self-pay | Admitting: Family Medicine

## 2022-08-16 ENCOUNTER — Ambulatory Visit (INDEPENDENT_AMBULATORY_CARE_PROVIDER_SITE_OTHER): Payer: Medicare PPO | Admitting: Family Medicine

## 2022-08-16 VITALS — BP 132/76 | HR 95 | Temp 97.3°F | Ht 64.0 in | Wt 145.2 lb

## 2022-08-16 DIAGNOSIS — F03B0 Unspecified dementia, moderate, without behavioral disturbance, psychotic disturbance, mood disturbance, and anxiety: Secondary | ICD-10-CM | POA: Diagnosis not present

## 2022-08-16 DIAGNOSIS — E785 Hyperlipidemia, unspecified: Secondary | ICD-10-CM | POA: Diagnosis not present

## 2022-08-16 DIAGNOSIS — Z0000 Encounter for general adult medical examination without abnormal findings: Secondary | ICD-10-CM | POA: Diagnosis not present

## 2022-08-16 DIAGNOSIS — D692 Other nonthrombocytopenic purpura: Secondary | ICD-10-CM

## 2022-08-16 LAB — CBC WITH DIFFERENTIAL/PLATELET
Basophils Absolute: 0 10*3/uL (ref 0.0–0.1)
Basophils Relative: 0.6 % (ref 0.0–3.0)
Eosinophils Absolute: 0 10*3/uL (ref 0.0–0.7)
Eosinophils Relative: 0.8 % (ref 0.0–5.0)
HCT: 39.9 % (ref 36.0–46.0)
Hemoglobin: 13.8 g/dL (ref 12.0–15.0)
Lymphocytes Relative: 21.4 % (ref 12.0–46.0)
Lymphs Abs: 0.9 10*3/uL (ref 0.7–4.0)
MCHC: 34.6 g/dL (ref 30.0–36.0)
MCV: 88.8 fl (ref 78.0–100.0)
Monocytes Absolute: 0.5 10*3/uL (ref 0.1–1.0)
Monocytes Relative: 11 % (ref 3.0–12.0)
Neutro Abs: 2.9 10*3/uL (ref 1.4–7.7)
Neutrophils Relative %: 66.2 % (ref 43.0–77.0)
Platelets: 213 10*3/uL (ref 150.0–400.0)
RBC: 4.49 Mil/uL (ref 3.87–5.11)
RDW: 14.5 % (ref 11.5–15.5)
WBC: 4.3 10*3/uL (ref 4.0–10.5)

## 2022-08-16 LAB — COMPREHENSIVE METABOLIC PANEL
ALT: 12 U/L (ref 0–35)
AST: 18 U/L (ref 0–37)
Albumin: 4.2 g/dL (ref 3.5–5.2)
Alkaline Phosphatase: 51 U/L (ref 39–117)
BUN: 23 mg/dL (ref 6–23)
CO2: 32 mEq/L (ref 19–32)
Calcium: 9.6 mg/dL (ref 8.4–10.5)
Chloride: 97 mEq/L (ref 96–112)
Creatinine, Ser: 0.86 mg/dL (ref 0.40–1.20)
GFR: 60.01 mL/min (ref >60.00)
Glucose, Bld: 94 mg/dL (ref 70–99)
Potassium: 4 mEq/L (ref 3.5–5.1)
Sodium: 137 mEq/L (ref 135–145)
Total Bilirubin: 0.6 mg/dL (ref 0.2–1.2)
Total Protein: 6.4 g/dL (ref 6.0–8.3)

## 2022-08-16 LAB — LIPID PANEL
Cholesterol: 258 mg/dL — ABNORMAL HIGH (ref 0–200)
HDL: 89.8 mg/dL (ref >39.00)
LDL Cholesterol: 157 mg/dL — ABNORMAL HIGH (ref 0–99)
NonHDL: 167.77
Total CHOL/HDL Ratio: 3
Triglycerides: 53 mg/dL (ref 0.0–149.0)
VLDL: 10.6 mg/dL (ref 0.0–40.0)

## 2022-08-16 NOTE — Progress Notes (Signed)
Phone [phone]   Subjective:  Patient presents today for their annual physical. Chief complaint-noted.   See problem oriented charting- ROS- full  review of systems was completed and negative except for: urinary incontinence, memory loss   The following were reviewed and entered/updated in epic: Past Medical History:  Diagnosis Date   Arthritis    Essential hypertension 05/07/2015   Obesity (BMI 30-39.9) 05/07/2015   Osteoarthritis of left hip    Patient Active Problem List   Diagnosis Date Noted   Moderate dementia without behavioral disturbance 01/07/2021    Priority: High   Risk for falls 07/02/2019    Priority: Medium    Hyperlipidemia 07/12/2016    Priority: Medium    Essential hypertension 05/07/2015    Priority: Medium    Low back pain 05/09/2017    Priority: Low   Senile purpura ([place]) 01/17/2017    Priority: Low   S/P left THA, AA 03/19/2013    Priority: Low   Nonspecific pain in the lumbar region 06/04/2008    Priority: Low   Pain of both hip joints 06/14/2018   Past Surgical History:  Procedure Laterality Date   [place] HIP ARTHROPLASTY Left 03/19/2013   Procedure: LEFT TOTAL HIP ARTHROPLASTY ANTERIOR APPROACH;  Surgeon: Mauri Pole, MD;  Location: [facility];  Service: Orthopedics;  Laterality: Left;    Family History  Problem Relation Age of Onset   Other Mother        infection of unknown site led to death- 73s   Other Father        lived to 9   Healthy Brother     Medications- reviewed and updated Current Outpatient Medications  Medication Sig Dispense Refill   amLODipine (NORVASC) 2.5 MG tablet TAKE 1 TABLET BY MOUTH EVERY DAY 90 tablet 3   bisacodyl (DULCOLAX) 5 MG EC tablet Take 5 mg by mouth daily as needed for moderate constipation.     calcium gluconate 500 MG tablet Take 500 mg by mouth daily.     clindamycin (CLEOCIN) 300 MG capsule      Cyanocobalamin (VITAMIN  B-12 PO) Take by mouth.     hydrochlorothiazide (HYDRODIURIL) 25 MG tablet TAKE 1 TABLET BY MOUTH EVERY DAY 90 tablet 3   rivastigmine (EXELON) 1.5 MG capsule Take 1 capsule (1.5 mg total) by mouth 2 (two) times daily. 60 capsule 11   VITAMIN D PO Take by mouth.     No current facility-administered medications for this visit.    Allergies-reviewed and updated Allergies  Allergen Reactions   Amoxicillin    Aricept [Donepezil]     Severe debilitating chills   Cortisone Acetate     REACTION: rash   Iodine     REACTION: rash   Prednisone     REACTION: rash   Triamcinolone Acetonide     REACTION: rash    Social History   Social History Narrative   Married Psychologist, forensic- lives with husband who is Dr. Yong Channel ptaient. . 2 daughters. 2 grandsons. 2 greatgrandchildren in 2020.       Retired, was a Geologist, engineering Situation: lives with husband      Spiritual Beliefs: protestant      Hobbies: golfing, cruises (outside of covid 19)      Right handed   Objective  Objective:  BP 132/76   Pulse 95   Temp (!) 97.3 F (36.3 C)  Ht 5\' 4"  (1.626 m)   Wt 145 lb 3.2 oz (65.9 kg)   SpO2 98%   BMI 24.92 kg/m  Gen: NAD, resting comfortably HEENT: Mucous membranes are moist. Oropharynx normal Neck: no thyromegaly CV: RRR no murmurs rubs or gallops Lungs: CTAB no crackles, wheeze, rhonchi Abdomen: soft/nontender/nondistended/normal bowel sounds. No rebound or guarding.  Ext: no edema Skin: warm, dry Neuro: looks heavily towards husband, moves all extremities, PERRLA, moves slowly onto table with heavy assist   Assessment and Plan   89 y.o. female presenting for annual physical.  Health Maintenance counseling: 1. Anticipatory guidance: Patient counseled regarding regular dental exams -q3 months, eye exams - recommended updating,  avoiding smoking and second hand smoke , limiting alcohol to 1 beverage per day-will actually advised avoidance given memory changes- not drinking , no  illicit drugs .   2. Risk factor reduction:  Advised patient of need for regular exercise and diet rich and fruits and vegetables to reduce risk of heart attack and stroke.  Exercise- moving regularly in the home at least.  Diet/weight management-weight down 21 pounds in the last year and was down 8 pounds last year- she and her husband are eating healthier- he's also down 27 lbs. .  Wt Readings from Last 3 Encounters:  08/16/22 145 lb 3.2 oz (65.9 kg)  02/01/22 151 lb 12.8 oz (68.9 kg)  09/22/21 165 lb 8 oz (75.1 kg)  3. Immunizations/screenings/ancillary studies-discussed shingles at pharmacy, discussed Prevnar 20- opts out, discussed updated COVID-19 vaccination- opts out, discussed flu shot- opts out  Immunization History  Administered Date(s) Administered   PFIZER(Purple Top)SARS-COV-2 Vaccination 08/19/2019, 09/09/2019, 05/04/2020   Pneumococcal Polysaccharide-23 06/04/2008   Td 04/22/2021   4. Cervical cancer screening-  past age based screning recommendations. Hysterectomy for benign reasons. No bleeding or discharge  5. Breast cancer screening-past age based screening recommendations-no longer gets mammograms 6. Colon cancer screening - past age based screening recommendations. No blood in stool or melena.  7. Skin cancer screening- [place] derm as needed- no recent checks advised regular sunscreen use. Denies worrisome, changing, or new skin lesions.  8. Birth control/STD check-  postmenopausal and monogamous 9. Osteoporosis screening at 67- no bone density on file-offered today and declines 10. Smoking associated screening - Never smoker  Status of chronic or acute concerns   #Moderate dementia-likely due to Alzheimer's disease without behavioral disturbance- does NOT plan to see neurology again S: MMSE 26 out of 30 on July 02, 2019 but later had progressive decline- referred to July 04, 2019, PA-C with neurology. Findings were highly suspicious for dementia, possibly due to  Alzheimer's disease  Medication: Rivastigmine/exelon 1.5 mg twice daily started July 2022-we are now prescribing - continued gradual decline A/P: ongoing issues nad gradual decline- continue current medications    #hypertension S: medication: HCTZ 25Mg -half tablet due to incontinence in past but back on full, Amlodipine 2.5Mg   BP Readings from Last 3 Encounters:  08/16/22 132/76  02/01/22 134/70  09/22/21 (!) 160/90  A/P: stable- continue current medicines   #hyperlipidemia-statin not indicated for primary prevention S: Medication: None Lab Results  Component Value Date   CHOL 277 (H) 07/29/2021   HDL 122.60 07/29/2021   LDLCALC 138 (H) 07/29/2021   LDLDIRECT 120.5 12/24/2012   TRIG 81.0 07/29/2021   CHOLHDL 2 07/29/2021   A/P: lipids mildly high but losing weight- recheck today- unlikely to start medicine  #Leukopenia-stable mild leukopenia over the years and other cell lines reassuring.  Update CBC with blood  work today- looked good last year  Lab Results  Component Value Date   WBC 5.0 02/01/2022   HGB 13.7 02/01/2022   HCT 40.1 02/01/2022   MCV 91.9 02/01/2022   PLT 184.0 02/01/2022   #senile purpura- noted bruising and bleeding. Stable. Check cbc at least annually  #urinary incontinence- did not improve on UTI treatment  Recommended follow up: Return in about 6 months (around 02/14/2023) for followup or sooner if needed.Schedule b4 you leave.  Lab/Order associations:NOT fasting   ICD-10-CM   1. Preventative health care  Z00.00     2. Hyperlipidemia, unspecified hyperlipidemia type  E78.5 CBC with Differential/Platelet    Comprehensive metabolic panel    Lipid panel    3. Moderate dementia without behavioral disturbance (HCC) Chronic F03.B0     4. Senile purpura (HCC) Chronic D69.2       No orders of the defined types were placed in this encounter.   Return precautions advised.  Garret Reddish, MD

## 2022-08-16 NOTE — Patient Instructions (Addendum)
Recommend updating eye exam  Please stop by lab before you go If you have mychart- we will send your results within 3 business days of [place] receiving them.  If you do not have mychart- we will call you about results within 5 business days of [place] receiving them.  *please also note that you will see labs on mychart as soon as they post. I will later go in and write notes on them- will say "notes from Dr. Yong Channel"   Recommended follow up: Return in about 6 months (around 02/14/2023) for followup or sooner if needed.Schedule b4 you leave.

## 2022-08-25 ENCOUNTER — Other Ambulatory Visit: Payer: Self-pay | Admitting: Family Medicine

## 2023-01-11 ENCOUNTER — Encounter: Payer: Self-pay | Admitting: Family Medicine

## 2023-01-11 ENCOUNTER — Telehealth: Payer: Self-pay | Admitting: Family Medicine

## 2023-01-11 NOTE — Telephone Encounter (Signed)
Pts husband states, they are both moving into an Assisted Living facility and for then to move in and for pt to get refills, [employer] states they need a letter for Home Health. Please advise.

## 2023-01-12 NOTE — Telephone Encounter (Signed)
Patient's husband called stating that Lori Ramirez is in need of a letter or some proof that PCP will order home health care. Based off of what caller was describing, it sounded as though certain orders were needed. I advised caller that while waiting for a call back to reach out to [facility] and have them call us to outline what's specifically needed. Caller verbalized understanding.

## 2023-01-12 NOTE — Telephone Encounter (Signed)
See mychart encounter for f/u on this conversation.

## 2023-02-09 ENCOUNTER — Other Ambulatory Visit: Payer: Self-pay | Admitting: Family Medicine

## 2023-02-14 ENCOUNTER — Ambulatory Visit: Payer: Medicare PPO | Admitting: Family Medicine

## 2023-02-14 ENCOUNTER — Encounter: Payer: Self-pay | Admitting: Family Medicine

## 2023-02-14 VITALS — BP 110/60 | HR 75 | Temp 97.3°F | Ht 64.0 in | Wt 141.8 lb

## 2023-02-14 DIAGNOSIS — E785 Hyperlipidemia, unspecified: Secondary | ICD-10-CM | POA: Diagnosis not present

## 2023-02-14 DIAGNOSIS — F03B0 Unspecified dementia, moderate, without behavioral disturbance, psychotic disturbance, mood disturbance, and anxiety: Secondary | ICD-10-CM | POA: Diagnosis not present

## 2023-02-14 DIAGNOSIS — I10 Essential (primary) hypertension: Secondary | ICD-10-CM

## 2023-02-14 NOTE — Patient Instructions (Addendum)
Let us know if you get any COVID vaccines at your pharmacy or SHINGRIX/Pneumonia vaccine/flu shots  Labs next visit since looked so good at most recent visit  Recommended follow up: Return in about 6 months (around 08/17/2023) for physical or sooner if needed.Schedule b4 you leave.

## 2023-02-14 NOTE — Progress Notes (Signed)
Phone [phone] In person visit   Subjective:   Lori Ramirez is a 89 y.o. year old very pleasant female patient who presents for/with See problem oriented charting Chief Complaint  Patient presents with   Medical Management of Chronic Issues   Hypertension    Past Medical History-  Patient Active Problem List   Diagnosis Date Noted   Moderate dementia without behavioral disturbance 01/07/2021    Priority: High   Risk for falls 07/02/2019    Priority: Medium    Hyperlipidemia 07/12/2016    Priority: Medium    Essential hypertension 05/07/2015    Priority: Medium    Low back pain 05/09/2017    Priority: Low   Senile purpura (HCC) 01/17/2017    Priority: Low   S/P left THA, AA 03/19/2013    Priority: Low   Nonspecific pain in the lumbar region 06/04/2008    Priority: Low   Pain of both hip joints 06/14/2018    Medications- reviewed and updated Current Outpatient Medications  Medication Sig Dispense Refill   amLODipine (NORVASC) 2.5 MG tablet TAKE 1 TABLET BY MOUTH EVERY DAY 90 tablet 3   bisacodyl (DULCOLAX) 5 MG EC tablet Take 5 mg by mouth daily as needed for moderate constipation.     calcium gluconate 500 MG tablet Take 500 mg by mouth daily.     clindamycin (CLEOCIN) 300 MG capsule      Cyanocobalamin (VITAMIN B-12 PO) Take by mouth.     hydrochlorothiazide (HYDRODIURIL) 25 MG tablet TAKE 1 TABLET BY MOUTH EVERY DAY 90 tablet 3   rivastigmine (EXELON) 1.5 MG capsule TAKE 1 CAPSULE (1.5 MG TOTAL) BY MOUTH TWICE A DAY 60 capsule 11   VITAMIN D PO Take by mouth.     No current facility-administered medications for this visit.     Objective:  BP 110/60   Pulse 75   Temp (!) 97.3 F (36.3 C)   Ht 5\' 4"  (1.626 m)   Wt 141 lb 12.8 oz (64.3 kg)   SpO2 95%   BMI 24.34 kg/m  Gen: NAD, resting comfortably CV: RRR no murmurs rubs or gallops Lungs: CTAB no crackles, wheeze, rhonchi Ext: no edema Skin: warm, dry Neuro: walks with cane    Assessment and  Plan   #Moderate dementia-likely due to Alzheimer's disease without behavioral disturbance S: MMSE 26 out of 30 on July 02, 2019 but later had progressive decline- referred to Craig Guess, PA-C with neurology. Findings were highly suspicious for dementia, possibly due to Alzheimer's disease  Medication: Rivastigmine/exelon 1.5 mg twice daily started July 2022-we are now prescribing- she and husband do NOT plan to see neurology again  A/P: continued gradual decline reported- continue current medicines   #hypertension S: medication: HCTZ 25Mg  (half tablet due to incontinence In past but on full now), Amlodipine 2.5Mg   BP Readings from Last 3 Encounters:  02/14/23 110/60  08/16/22 132/76  02/01/22 134/70  A/P: stable- continue current medicines   #hyperlipidemia-statin not indicated for primary prevention S: Medication:None Lab Results  Component Value Date   CHOL 258 (H) 08/16/2022   HDL 89.80 08/16/2022   LDLCALC 157 (H) 08/16/2022   LDLDIRECT 120.5 12/24/2012   TRIG 53.0 08/16/2022   CHOLHDL 3 08/16/2022  A/P: above goal but at her age hesitant to start medicine- continue to monitor- hoping at [place] she makes healthy choices and perhaps we get some mild improvement  Recommended follow up: Return in about 6 months (around 08/17/2023) for physical or sooner  if needed.Schedule b4 you leave.  Lab/Order associations:   ICD-10-CM   1. Moderate dementia without behavioral disturbance  F03.B0     2. Essential hypertension  I10     3. Hyperlipidemia, unspecified hyperlipidemia type  E78.5       No orders of the defined types were placed in this encounter.   Return precautions advised.  Tana Conch, MD

## 2023-02-25 ENCOUNTER — Other Ambulatory Visit: Payer: Self-pay | Admitting: Family Medicine

## 2023-03-03 ENCOUNTER — Encounter: Payer: Self-pay | Admitting: Family Medicine

## 2023-03-22 ENCOUNTER — Telehealth: Payer: Self-pay | Admitting: Family Medicine

## 2023-03-22 DIAGNOSIS — F03B0 Unspecified dementia, moderate, without behavioral disturbance, psychotic disturbance, mood disturbance, and anxiety: Secondary | ICD-10-CM

## 2023-03-22 NOTE — Telephone Encounter (Signed)
Worsening memory loss- daughter and husband want to return to neurology which I am supportive of.   Encouraged him to call neurology in a week if they have not heard

## 2023-04-10 ENCOUNTER — Telehealth: Payer: Self-pay | Admitting: Family Medicine

## 2023-04-10 MED ORDER — HYDROCHLOROTHIAZIDE 25 MG PO TABS: 25.0000 mg | ORAL_TABLET | Freq: Every day | ORAL | 3 refills | Status: AC

## 2023-04-10 MED ORDER — RIVASTIGMINE TARTRATE 1.5 MG PO CAPS: 1.5000 mg | ORAL_CAPSULE | Freq: Two times a day (BID) | ORAL | 3 refills | Status: DC

## 2023-04-10 NOTE — Telephone Encounter (Signed)
Prescription Request  04/10/2023  LOV: 02/14/2023  What is the name of the medication or equipment? rivastigmine (EXELON) 1.5 MG capsule   hydrochlorothiazide (HYDRODIURIL) 25 MG tablet   Have you contacted your pharmacy to request a refill? Yes   Which pharmacy would you like this sent to?  [facility]  [address] [place], [place], [place] [address] [phone]   THIS IS PATIENT'S NEW PHARMACY   Patient notified that their request is being sent to the clinical staff for review and that they should receive a response within 2 business days.   Please advise at Mobile [phone] (mobile)

## 2023-04-10 NOTE — Telephone Encounter (Signed)
Rx sent to requested pharmacy

## 2023-05-03 ENCOUNTER — Other Ambulatory Visit (INDEPENDENT_AMBULATORY_CARE_PROVIDER_SITE_OTHER): Payer: Medicare PPO

## 2023-05-03 ENCOUNTER — Ambulatory Visit: Payer: Medicare PPO | Admitting: Physician Assistant

## 2023-05-03 ENCOUNTER — Ambulatory Visit: Payer: Medicare PPO

## 2023-05-03 ENCOUNTER — Encounter: Payer: Self-pay | Admitting: Physician Assistant

## 2023-05-03 VITALS — BP 145/73 | HR 80 | Resp 18 | Ht 61.0 in | Wt 142.0 lb

## 2023-05-03 DIAGNOSIS — R413 Other amnesia: Secondary | ICD-10-CM | POA: Diagnosis not present

## 2023-05-03 DIAGNOSIS — F0390 Unspecified dementia without behavioral disturbance: Secondary | ICD-10-CM | POA: Diagnosis not present

## 2023-05-03 MED ORDER — RIVASTIGMINE TARTRATE 3 MG PO CAPS: 3.0000 mg | ORAL_CAPSULE | Freq: Two times a day (BID) | ORAL | 11 refills | Status: DC

## 2023-05-03 NOTE — Patient Instructions (Addendum)
It was a pleasure to see you today at our office.   Recommendations:   MRI of the brain, the radiology office will call you to arrange you appointment   Check labs today  Follow up in 3 months Recommend visiting the website : " Dementia Success Path" to better understand some behaviors related to memory loss.    For psychiatric meds, mood meds: Please have your primary care physician manage these medications.  If you have any severe symptoms of a stroke, or other severe issues such as confusion,severe chills or fever, etc call 911 or go to the ER as you may need to be evaluated further    For assessment of decision of mental capacity and competency:  Call Dr. Erick Blinks, geriatric psychiatrist at [phone]  Counseling regarding caregiver distress, including caregiver depression, anxiety and issues regarding community resources, adult day care programs, adult living facilities, or memory care questions:  please contact your  Primary Doctor's Social Worker   Whom to call: Memory  decline, memory medications: Call our office [phone]    [email or web address]   RECOMMENDATIONS FOR ALL PATIENTS WITH MEMORY PROBLEMS: 1. Continue to exercise (Recommend 30 minutes of walking everyday, or 3 hours every week) 2. Increase social interactions - continue going to [place] and enjoy social gatherings with friends and family 3. Eat healthy, avoid fried foods and eat more fruits and vegetables 4. Maintain adequate blood pressure, blood sugar, and blood cholesterol level. Reducing the risk of stroke and cardiovascular disease also helps promoting better memory. 5. Avoid stressful situations. Live a simple life and avoid aggravations. Organize your time and prepare for the next day in anticipation. 6. Sleep well, avoid any interruptions of sleep and avoid any distractions in the bedroom that may interfere with adequate sleep quality 7. Avoid  sugar, avoid sweets as there is a strong link between excessive sugar intake, diabetes, and cognitive impairment We discussed the Mediterranean diet, which has been shown to help patients reduce the risk of progressive memory disorders and reduces cardiovascular risk. This includes eating fish, eat fruits and green leafy vegetables, nuts like almonds and hazelnuts, walnuts, and also use olive oil. Avoid fast foods and fried foods as much as possible. Avoid sweets and sugar as sugar use has been linked to worsening of memory function.  There is always a concern of gradual progression of memory problems. If this is the case, then we may need to adjust level of care according to patient needs. Support, both to the patient and caregiver, should then be put into place.      You have been referred for a neuropsychological evaluation (i.e., evaluation of memory and thinking abilities). Please bring someone with you to this appointment if possible, as it is helpful for the doctor to hear from both you and another adult who knows you well. Please bring eyeglasses and hearing aids if you wear them.    The evaluation will take approximately 3 hours and has two parts:   The first part is a clinical interview with the neuropsychologist (Dr. Milbert Coulter or Dr. Roseanne Reno). During the interview, the neuropsychologist will speak with you and the individual you brought to the appointment.    The second part of the evaluation is testing with the doctor's technician Annabelle Harman or Selena Batten). During the testing, the technician will ask you to remember different types of material, solve problems, and answer some questionnaires. Your family member will not be present for this portion of the evaluation.   Please  note: We must reserve several hours of the neuropsychologist's time and the psychometrician's time for your evaluation appointment. As such, there is a No-Show fee of $100. If you are unable to attend any of your appointments, please  contact our office as soon as possible to reschedule.      DRIVING: Regarding driving, in patients with progressive memory problems, driving will be impaired. We advise to have someone else do the driving if trouble finding directions or if minor accidents are reported. Independent driving assessment is available to determine safety of driving.   If you are interested in the driving assessment, you can contact the following:  [facility] [employer] [facility] in [place] [phone]  [facility] [phone]  [facility] [phone]  [facility] [phone] or [phone]   FALL PRECAUTIONS: Be cautious when walking. Scan the area for obstacles that may increase the risk of trips and falls. When getting up in the mornings, sit up at the edge of the bed for a few minutes before getting out of bed. Consider elevating the bed at the head end to avoid drop of blood pressure when getting up. Walk always in a well-lit room (use night lights in the walls). Avoid area rugs or power cords from appliances in the middle of the walkways. Use a walker or a cane if necessary and consider physical therapy for balance exercise. Get your eyesight checked regularly.  FINANCIAL OVERSIGHT: Supervision, especially oversight when making financial decisions or transactions is also recommended.  HOME SAFETY: Consider the safety of the kitchen when operating appliances like stoves, microwave oven, and blender. Consider having supervision and share cooking responsibilities until no longer able to participate in those. Accidents with firearms and other hazards in the house should be identified and addressed as well.   ABILITY TO BE LEFT ALONE: If patient is unable to contact 911 operator, consider using LifeLine, or when the need is there, arrange for someone to stay with patients. Smoking is a fire hazard, consider supervision or cessation. Risk of wandering should be assessed by  caregiver and if detected at any point, supervision and safe proof recommendations should be instituted.  MEDICATION SUPERVISION: Inability to self-administer medication needs to be constantly addressed. Implement a mechanism to ensure safe administration of the medications.      Mediterranean Diet A Mediterranean diet refers to food and lifestyle choices that are based on the traditions of countries located on the [employer]. This way of eating has been shown to help prevent certain conditions and improve outcomes for people who have chronic diseases, like kidney disease and heart disease. What are tips for following this plan? Lifestyle  Cook and eat meals together with your family, when possible. Drink enough fluid to keep your urine clear or pale yellow. Be physically active every day. This includes: Aerobic exercise like running or swimming. Leisure activities like gardening, walking, or housework. Get 7-8 hours of sleep each night. If recommended by your health care provider, drink red wine in moderation. This means 1 glass a day for nonpregnant women and 2 glasses a day for men. A glass of wine equals 5 oz (150 mL). Reading food labels  Check the serving size of packaged foods. For foods such as rice and pasta, the serving size refers to the amount of cooked product, not dry. Check the total fat in packaged foods. Avoid foods that have saturated fat or trans fats. Check the ingredients list for added sugars, such as corn syrup. Shopping  At the grocery store, buy most of your food from the areas near the walls of the store. This includes: Fresh fruits and vegetables (produce). Grains, beans, nuts, and seeds. Some of these may be available in unpackaged forms or large amounts (in bulk). Fresh seafood. Poultry and eggs. Low-fat dairy products. Buy whole ingredients instead of prepackaged foods. Buy fresh fruits and vegetables in-season from local farmers markets. Buy  frozen fruits and vegetables in resealable bags. If you do not have access to quality fresh seafood, buy precooked frozen shrimp or canned fish, such as tuna, salmon, or sardines. Buy small amounts of raw or cooked vegetables, salads, or olives from the deli or salad bar at your store. Stock your pantry so you always have certain foods on hand, such as olive oil, canned tuna, canned tomatoes, rice, pasta, and beans. Cooking  Cook foods with extra-virgin olive oil instead of using butter or other vegetable oils. Have meat as a side dish, and have vegetables or grains as your main dish. This means having meat in small portions or adding small amounts of meat to foods like pasta or stew. Use beans or vegetables instead of meat in common dishes like chili or lasagna. Experiment with different cooking methods. Try roasting or broiling vegetables instead of steaming or sauteing them. Add frozen vegetables to soups, stews, pasta, or rice. Add nuts or seeds for added healthy fat at each meal. You can add these to yogurt, salads, or vegetable dishes. Marinate fish or vegetables using olive oil, lemon juice, garlic, and fresh herbs. Meal planning  Plan to eat 1 vegetarian meal one day each week. Try to work up to 2 vegetarian meals, if possible. Eat seafood 2 or more times a week. Have healthy snacks readily available, such as: Vegetable sticks with hummus. Greek yogurt. Fruit and nut trail mix. Eat balanced meals throughout the week. This includes: Fruit: 2-3 servings a day Vegetables: 4-5 servings a day Low-fat dairy: 2 servings a day Fish, poultry, or lean meat: 1 serving a day Beans and legumes: 2 or more servings a week Nuts and seeds: 1-2 servings a day Whole grains: 6-8 servings a day Extra-virgin olive oil: 3-4 servings a day Limit red meat and sweets to only a few servings a month What are my food choices? Mediterranean diet Recommended Grains: Whole-grain pasta. Brown rice. Bulgar  wheat. Polenta. Couscous. Whole-wheat bread. Orpah Cobb. Vegetables: Artichokes. Beets. Broccoli. Cabbage. Carrots. Eggplant. Green beans. Chard. Kale. Spinach. Onions. Leeks. Peas. Squash. Tomatoes. Peppers. Radishes. Fruits: Apples. Apricots. Avocado. Berries. Bananas. Cherries. Dates. Figs. Grapes. Lemons. Melon. Oranges. Peaches. Plums. Pomegranate. Meats and other protein foods: Beans. Almonds. Sunflower seeds. Pine nuts. Peanuts. Cod. Salmon. Scallops. Shrimp. Tuna. Tilapia. Clams. Oysters. Eggs. Dairy: Low-fat milk. Cheese. Greek yogurt. Beverages: Water. Red wine. Herbal tea. Fats and oils: Extra virgin olive oil. Avocado oil. Grape seed oil. Sweets and desserts: [place] yogurt with honey. Baked apples. Poached pears. Trail mix. Seasoning and other foods: Basil. Cilantro. Coriander. Cumin. Mint. Parsley. Sage. Rosemary. Tarragon. Garlic. Oregano. Thyme. Pepper. Balsalmic vinegar. Tahini. Hummus. Tomato sauce. Olives. Mushrooms. Limit these Grains: Prepackaged pasta or rice dishes. Prepackaged cereal with added sugar. Vegetables: Deep fried potatoes (french fries). Fruits: Fruit canned in syrup. Meats and other protein foods: Beef. Pork. Lamb. Poultry with skin. Hot dogs. Tomasa Blase. Dairy: Ice cream. Sour cream. Whole milk. Beverages: Juice. Sugar-sweetened soft drinks. Beer. Liquor and spirits. Fats and oils: Butter. Canola oil. Vegetable oil. Beef fat (tallow). Lard. Sweets and desserts: Cookies.  Cakes. Pies. Candy. Seasoning and other foods: Mayonnaise. Premade sauces and marinades. The items listed may not be a complete list. Talk with your dietitian about what dietary choices are right for you. Summary The Mediterranean diet includes both food and lifestyle choices. Eat a variety of fresh fruits and vegetables, beans, nuts, seeds, and whole grains. Limit the amount of red meat and sweets that you eat. Talk with your health care provider about whether it is safe for you to drink red  wine in moderation. This means 1 glass a day for nonpregnant women and 2 glasses a day for men. A glass of wine equals 5 oz (150 mL). This information is not intended to replace advice given to you by your health care provider. Make sure you discuss any questions you have with your health care provider. Document Released: 03/03/2016 Document Revised: 04/05/2016 Document Reviewed: 03/03/2016 Elsevier Interactive Patient Education  2017 [employer].

## 2023-05-03 NOTE — Progress Notes (Signed)
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 RH female with a history of dementia likely due to Alzheimer's disease, initially seen on 12/2020, but losing to follow up. Her daughter reports that she did not want to follow up with [place] because she "hates going to the doctor, her father was a doctor and she does not like being seen" Two times MRI of the brain has been ordered but she apparently has cancelled the appointment. Her PCP has referred her here despite the above as her memory may have worsened according to her husband. MoCA was unable to be performed. MMSE today is 25/30, actually slightly improved from prior. Had a frank discussion with her husband, as, in an effort to help her, he does most ADLs for her, but he answers the questions addressed to her as well, which, according to daughter, has hindered her ability to make decisions. She has been questioning herself and has been more reserved, withdrawn due to this. He agreed to let her  perform to her abilities and "think for herself". I also recommended seeking caregiver support for him (they have been married almost 70 years).. She is on rivastigmine 1.5 mg bis by PCP, tolerating well.      Follow up in 6 months. Check B12 and TSH  Recommend increasing rivastigmine to 3 mg bid for better coverage.  Recommend good control of her cardiovascular risk factors Continue to control mood as per PCP     Subjective:    This patient is accompanied in the office by her husband and daughter who supplements the history.  Previous records as well as any outside records available were reviewed prior to todays visit. Patient was last seen on 12/2020 with MoCA 12/30   Any changes in memory since last visit? " Much more alert since ar [place].  She able to retain new information and names. She may confuse the colors, especially with shoes according to her  husband (he admits that he is a perfectionist and gets irritated easily if she confuses dark brown with black). repeats oneself?  Endorsed Disoriented when walking into a room? Not always, she may feel disoriented if not at her home.     Leaving objects in unusual places?  May misplace things but not in unusual places   Wandering behavior?  Denies.   Any personality changes since last visit?   A little less patient than before.    Any worsening depression?:  She has some depression regarding her memory issues    Hallucinations or paranoia?  Denies.   Seizures? denies    Any sleep changes? Sleeps well.  Denies vivid dreams, REM behavior or sleepwalking   Sleep apnea?   Denies.   Any hygiene concerns? Denies.  Independent of bathing and dressing?  Endorsed  Does the patient needs help with medications?  Husband is in charge  Who is in charge of the finances?  Husband and Daughter is in charge    Any changes in appetite?  denies    Patient have trouble swallowing? Denies.   Does the patient cook? No Any headaches?   Denies.   Chronic back pain  denies.   Ambulates with difficulty? Needs a walker to ambulate for stability  Recent falls or head injuries? denies     Unilateral weakness, numbness or tingling? denies   Any tremors?  Denies   Any anosmia?  Denies  Any incontinence of urine?  Endorsed, wears diapers Any bowel dysfunction?   Denies      Patient lives with  husband Does the patient drive? No longer drives    Initial visit 01/07/21   The patient is seen in neurologic consultation at the request of Shelva Majestic, MD for the evaluation of memory.  The patient is accompanied by husband Lori Ramirez who supplements the history.   The patient is a 89 y.o. year old female who has had memory issues for about  2 years. Dr. Durene Cal had recommended Neuro evaluation at the end of 2020, but she declined until now, when her memory loss has become more noticeable by her husband Lori Ramirez. Back  then, was 28/30.  A repeat at Dr. Erasmo Leventhal office yielded a 26/30.  Although she feels that her memory is fair, her husband reports that she cannot remember places, or people that she actually knew.  She claims this is due to a lack of interaction due to the pandemic.  However, her husband is 67 years, reports that she is now having significant cognitive decline, especially over the last 6 months.  He states that she is highly educated, very smart, and very well travelled.  She is a retired English as a second language teacher.  Husband states that now, she has become very "annoying ", she stays in the same room, where she states 80% of the time, pointing at things that are not there, or talks about meaningless issues such as "why is in the squirrel outside, or why are these golfers in my backyard, why those two women looking at me etc."  She reports being" okay", and that her mood is "nothing ", denying depression or irritability.  She denies hallucinations or paranoia, or vivid dreams or sleepwalking.  He reports that her long-term memory is still good.   She now has to bathe with the help of her husband, who has to dry her, put her underwear on, and her shoes on.  She no longer takes her medications by herself.  Her husband places daily and takes them in front of her husband every morning and every afternoon.  She misplaces, objects, such as dipstick, but denies placing them in unusual locations.  Her husband has taken over the finances, and lately the cooking.  She is to be a good cook, but ever since she left the stove on about 3 months ago, she is only dedicated to making a salad.  She is to know very well her recipes, and now she cannot read or follow those instructions if these recipes were written.  She no longer drives.  She has been ambulating with difficulty due to arthritis. Repeatedly, she has declined PT evaluation as recommended by her PCP, "because it was not helping for when I had my hip surgery ".  She has  several mechanical falls over the last year without head injury, but admits that her balance is off as well, especially with a cane.  She is mostly now ambulating with a walker.   Denies headaches, trauma, or injuries to the head, double vision, dizziness, focal numbness or tingling, unilateral weakness or tremors.  She has urine incontinence, denies retention.  She denies any constipation or diarrhea.  Denies any history of OSA, alcohol or tobacco.  Family history negative for Alzheimer's disease.     Last labs performed on 07/02/2020 showed total cholesterol of 261, with LDL of 127, elevated.  Normal HDL and TG.;   CBC and CMP were  normal   PREVIOUS MEDICATIONS: Donepezil (GI).  CURRENT MEDICATIONS:  Outpatient Encounter Medications as of 05/03/2023  Medication Sig   amLODipine (NORVASC) 2.5 MG tablet TAKE 1 TABLET BY MOUTH EVERY DAY   bisacodyl (DULCOLAX) 5 MG EC tablet Take 5 mg by mouth daily as needed for moderate constipation.   calcium gluconate 500 MG tablet Take 500 mg by mouth daily.   clindamycin (CLEOCIN) 300 MG capsule    Cyanocobalamin (VITAMIN B-12 PO) Take by mouth.   hydrochlorothiazide (HYDRODIURIL) 25 MG tablet Take 1 tablet (25 mg total) by mouth daily.   VITAMIN D PO Take by mouth.   [DISCONTINUED] rivastigmine (EXELON) 1.5 MG capsule Take 1 capsule (1.5 mg total) by mouth 2 (two) times daily.   rivastigmine (EXELON) 3 MG capsule Take 1 capsule (3 mg total) by mouth 2 (two) times daily.   No facility-administered encounter medications on file as of 05/03/2023.       05/03/2023    5:00 PM 07/02/2019    9:32 AM 01/17/2017    8:30 AM  MMSE - Mini Mental State Exam  Orientation to time 1 5 5   Orientation to Place 5 4 5   Registration 3 3 3   Attention/ Calculation 5 5 5   Recall 2 2 1   Language- name 2 objects 2 2 2   Language- repeat 1 1 1   Language- follow 3 step command 3 3 3   Language- read & follow direction 1 1 1   Write a sentence 1 1 1   Copy design 1 1 1    Total score 25 28 28       01/07/2021    2:00 PM  Montreal Cognitive Assessment   Visuospatial/ Executive (0/5) 0  Naming (0/3) 3  Attention: Read list of digits (0/2) 2  Attention: Read list of letters (0/1) 1  Attention: Serial 7 subtraction starting at 100 (0/3) 2  Language: Repeat phrase (0/2) 0  Language : Fluency (0/1) 0  Abstraction (0/2) 0  Delayed Recall (0/5) 0  Orientation (0/6) 4  Total 12  Adjusted Score (based on education) 12    Objective:     PHYSICAL EXAMINATION:    VITALS:   Vitals:   05/03/23 1413  BP: (!) 145/73  Pulse: 80  Resp: 18  SpO2: 98%  Weight: 142 lb (64.4 kg)  Height: 5\' 1"  (1.549 m)    GEN:  The patient appears stated age and is in NAD. HEENT:  Normocephalic, atraumatic.   Neurological examination:  General: NAD, well-groomed, appears stated age. Orientation: The patient is alert. Oriented to person, place and not to date (daughter reports that her husband is responsible to tell her what date it is, so she never pays attention) Cranial nerves: There is good facial symmetry. Flat affect. The speech is fluent and clear. No aphasia or dysarthria. Fund of knowledge is appropriate. Recent and remote memory are impaired. Attention and concentration are reduced.  Able to name objects and repeat phrases.  Hearing is intact to conversational tone.   Sensation: Sensation is intact to light touch throughout Motor: Strength is at least antigravity x4. DTR's 2/4 in UE/LE     Movement examination: Tone: There is normal tone in the UE/LE Abnormal movements:  no tremor.  No myoclonus.  No asterixis.   Coordination:  There is no decremation with RAM's. Normal finger to nose  Gait and Station: The patient has no difficulty arising out of a deep-seated chair without the use of the hands. The patient's stride length is good.  Gait is cautious and narrow.    Thank you for allowing [place] the opportunity to participate in the care of this nice patient.  Please do not hesitate to contact us for any questions or concerns.   Total time spent on today's visit was 50 minutes dedicated to this patient today, preparing to see patient, examining the patient, ordering tests and/or medications and counseling the patient, documenting clinical information in the EHR or other health record, independently interpreting results and communicating results to the patient/family, discussing treatment and goals, answering patient's questions and coordinating care.  Cc:  Shelva Majestic, MD  Marlowe Kays 05/03/2023 5:22 PM

## 2023-05-04 ENCOUNTER — Telehealth: Payer: Self-pay | Admitting: Family Medicine

## 2023-05-04 ENCOUNTER — Telehealth: Payer: Self-pay | Admitting: Physician Assistant

## 2023-05-04 LAB — VITAMIN B12: Vitamin B-12: 1001 pg/mL — ABNORMAL HIGH (ref 211–911)

## 2023-05-04 LAB — TSH: TSH: 2.86 u[IU]/mL (ref 0.35–5.50)

## 2023-05-04 NOTE — Telephone Encounter (Signed)
Patient's husband requests to be called re: How much Vitamin B12 should Patient be taking-500 or 1000? Needs to know before ordering today at 12 noon.

## 2023-05-04 NOTE — Telephone Encounter (Signed)
FYI, I Called and spoke with pt husband and informed him I am unsure of what dose of Vit B12pt should be on. I saw that Dr. Judi Saa had ordered labs on pt yesterday and they haven't been resulted yet and he also had a call into their office with the same question. I advised him to wait on a return call from Dr. Kizzie Fantasia office since she was the one that ordered labs and Dr. Durene Cal is out of office until 11/01 (pt states he knows he is not going to get a return call from Dr. Jeremy Johann office before noon today).

## 2023-05-04 NOTE — Telephone Encounter (Signed)
Patients husband called asking what milligram should the patient take of vitamin B-12

## 2023-05-04 NOTE — Telephone Encounter (Signed)
This has been addressed by Dr. Jeremy Johann nurse.

## 2023-05-04 NOTE — Telephone Encounter (Signed)
Advised to start b12 qod

## 2023-05-04 NOTE — Progress Notes (Signed)
B12 is normal, can continue taking B12 every other day, but it is goo. Thyroid is normal

## 2023-05-05 ENCOUNTER — Encounter: Payer: Self-pay | Admitting: Physician Assistant

## 2023-05-30 ENCOUNTER — Encounter: Payer: Self-pay | Admitting: Physician Assistant

## 2023-06-05 ENCOUNTER — Telehealth: Payer: Self-pay | Admitting: Family Medicine

## 2023-06-05 NOTE — Telephone Encounter (Signed)
Ok to give VO? 

## 2023-06-05 NOTE — Telephone Encounter (Signed)
It says needs "orders for urinary tract infection" - I would recommend office visit - looks to be scheduled tomorrow

## 2023-06-05 NOTE — Telephone Encounter (Signed)
Received call from [facility] stating Patient being treated by Provider at [facility]

## 2023-06-05 NOTE — Telephone Encounter (Signed)
FYI

## 2023-06-05 NOTE — Telephone Encounter (Signed)
Verbal Orders  Agency:  [facility]   Caller:  Secondary school teacher and title  Requesting OT/ PT/ Skilled nursing/ Social Work/ Speech:    Reason for Request:  Orders for UTI  Frequency:    HH needs F2F w/in last 30 days      [phone]

## 2023-06-06 ENCOUNTER — Ambulatory Visit: Payer: Medicare PPO | Admitting: Internal Medicine

## 2023-06-06 ENCOUNTER — Other Ambulatory Visit: Payer: Self-pay | Admitting: Internal Medicine

## 2023-06-06 ENCOUNTER — Ambulatory Visit: Payer: Medicare PPO | Admitting: Family

## 2023-06-06 VITALS — BP 124/80 | HR 96 | Temp 98.0°F | Resp 18 | Ht 61.0 in | Wt 145.1 lb

## 2023-06-06 DIAGNOSIS — Z6827 Body mass index (BMI) 27.0-27.9, adult: Secondary | ICD-10-CM

## 2023-06-06 DIAGNOSIS — R319 Hematuria, unspecified: Secondary | ICD-10-CM

## 2023-06-06 DIAGNOSIS — N390 Urinary tract infection, site not specified: Secondary | ICD-10-CM | POA: Diagnosis not present

## 2023-06-06 LAB — POCT URINALYSIS DIPSTICK
Bilirubin, UA: NEGATIVE
Blood, UA: POSITIVE
Glucose, UA: NEGATIVE
Ketones, UA: NEGATIVE
Nitrite, UA: NEGATIVE
Protein, UA: POSITIVE — AB
Spec Grav, UA: 1.01 (ref 1.010–1.025)
Urobilinogen, UA: 0.2 U/dL
pH, UA: 6 (ref 5.0–8.0)

## 2023-06-06 MED ORDER — CEPHALEXIN 500 MG PO CAPS: 500.0000 mg | ORAL_CAPSULE | Freq: Four times a day (QID) | ORAL | 0 refills | Status: AC

## 2023-06-06 NOTE — Progress Notes (Addendum)
New Patient Office Visit  Subjective    Patient ID: Lori Ramirez, female    DOB: [birth year]  Age: 89 y.o. MRN: [id number]  CC:  Chief Complaint  Patient presents with   New Patient (Initial Visit)    New patient    HPI Lori Ramirez presents c/o increase urination and goes to bathroom every 2-3 hours. Her care giver noted that her urine smell very strong. Her daughter sent her here to be checked. Her appetite is good. No fever or chills. No abdomen pain.   Outpatient Encounter Medications as of 06/06/2023  Medication Sig   amLODipine (NORVASC) 2.5 MG tablet TAKE 1 TABLET BY MOUTH EVERY DAY   bisacodyl (DULCOLAX) 5 MG EC tablet Take 5 mg by mouth daily as needed for moderate constipation.   calcium gluconate 500 MG tablet Take 500 mg by mouth daily.   clindamycin (CLEOCIN) 300 MG capsule    Cyanocobalamin (VITAMIN B-12 PO) Take by mouth.   hydrochlorothiazide (HYDRODIURIL) 25 MG tablet Take 1 tablet (25 mg total) by mouth daily.   rivastigmine (EXELON) 3 MG capsule Take 1 capsule (3 mg total) by mouth 2 (two) times daily.   VITAMIN D PO Take by mouth.   No facility-administered encounter medications on file as of 06/06/2023.    Past Medical History:  Diagnosis Date   Arthritis    Essential hypertension 05/07/2015   Obesity (BMI 30-39.9) 05/07/2015   Osteoarthritis of left hip     Past Surgical History:  Procedure Laterality Date   ABDOMINAL HYSTERECTOMY  1977   TONSILLECTOMY AND ADENOIDECTOMY  1939   TOTAL HIP ARTHROPLASTY Left 03/19/2013   Procedure: LEFT TOTAL HIP ARTHROPLASTY ANTERIOR APPROACH;  Surgeon: [name], MD;  Location: [facility];  Service: Orthopedics;  Laterality: Left;    Family History  Problem Relation Age of Onset   Other Mother        infection of unknown site led to death- 75s   Other Father        lived to 4   Healthy Brother     Social History   Socioeconomic History   Marital status: Married    Spouse name: Not on file   Number  of children: Not on file   Years of education: Not on file   Highest education level: Not on file  Occupational History   Not on file  Tobacco Use   Smoking status: Never   Smokeless tobacco: Never  Vaping Use   Vaping status: Never Used  Substance and Sexual Activity   Alcohol use: Yes    Alcohol/week: 14.0 standard drinks of alcohol    Types: 14 Glasses of wine per week    Comment: 1-2 glasses of wine daily    Drug use: No   Sexual activity: Not on file  Other Topics Concern   Not on file  Social History Narrative   Married 1955- lives with husband who is Dr. Durene Ramirez patient -lives in home . 2 daughters. 2 grandsons. 2 greatgrandchildren in 2020.       Retired, was a Tree surgeon Situation: lives with husband      Spiritual Beliefs: protestant      Hobbies: golfing, cruises (outside of covid 19)      Right handed   Social Determinants of Health   Financial Resource Strain: Not on file  Food Insecurity: Not on file  Transportation Needs: Not on file  Physical Activity: Not on file  Stress: Not on file  Social Connections: Unknown (12/07/2021)   Received from [id number], [facility]   Social Network    Social Network: Not on file  Intimate Partner Violence: Unknown (10/29/2021)   Received from [facility], [facility]   HITS    Physically Hurt: Not on file    Insult or Talk Down To: Not on file    Threaten Physical Harm: Not on file    Scream or Curse: Not on file    Review of Systems  Constitutional: Negative.   Genitourinary:  Positive for frequency.        Objective    BP 124/80 (BP Location: Left Arm, Patient Position: Sitting, Cuff Size: Normal)   Pulse 96   Temp 98 F (36.7 C)   Resp 18   Ht 5\' 1"  (1.549 m)   Wt 145 lb 2 oz (65.8 kg)   SpO2 96%   BMI 27.42 kg/m   Physical Exam Constitutional:      Appearance: Normal appearance.  Abdominal:     General: Bowel sounds are normal.        Assessment & Plan:    Problem List Items Addressed This Visit       Genitourinary   Urinary tract infection with hematuria - Primary    She has hematuria and leukocyte. I will sent her urine for c/s. I will start her on cephalexin 500 mg every 6 hours for 7 days.      Relevant Medications   cephALEXin (KEFLEX) 500 MG capsule   Other Relevant Orders   POCT urinalysis dipstick (Completed)    No follow-ups on file.   Eloisa Northern, MD

## 2023-06-06 NOTE — Assessment & Plan Note (Signed)
She has hematuria and leukocyte. I will sent her urine for c/s. I will start her on cephalexin 500 mg every 6 hours for 7 days.

## 2023-06-09 LAB — URINE CULTURE

## 2023-06-16 ENCOUNTER — Ambulatory Visit
Admission: RE | Admit: 2023-06-16 | Discharge: 2023-06-16 | Disposition: A | Discharge status: Home / Self Care | Payer: Medicare PPO | Source: Ambulatory Visit | Attending: Physician Assistant | Admitting: Physician Assistant

## 2023-06-16 DIAGNOSIS — I679 Cerebrovascular disease, unspecified: Secondary | ICD-10-CM | POA: Diagnosis not present

## 2023-06-16 MED ORDER — GADOPICLENOL 0.5 MMOL/ML IV SOLN: 9.0000 mL | Freq: Once | INTRAVENOUS | Status: DC | PRN

## 2023-06-28 ENCOUNTER — Encounter: Payer: Self-pay | Admitting: Physician Assistant

## 2023-07-04 NOTE — Progress Notes (Signed)
MRI brain shows chronic advanced changes, volume loss and and circulation changes. No new strokes or masses.  Thanks

## 2023-07-12 ENCOUNTER — Other Ambulatory Visit: Payer: Self-pay

## 2023-07-12 ENCOUNTER — Encounter (HOSPITAL_COMMUNITY): Payer: Self-pay | Admitting: Emergency Medicine

## 2023-07-12 ENCOUNTER — Emergency Department (HOSPITAL_COMMUNITY)
Admission: EM | Admit: 2023-07-12 | Discharge: 2023-07-12 | Disposition: A | Discharge status: Home / Self Care | Payer: Medicare PPO

## 2023-07-12 ENCOUNTER — Emergency Department (HOSPITAL_COMMUNITY): Payer: Medicare PPO

## 2023-07-12 DIAGNOSIS — W19XXXA Unspecified fall, initial encounter: Secondary | ICD-10-CM

## 2023-07-12 DIAGNOSIS — R9082 White matter disease, unspecified: Secondary | ICD-10-CM | POA: Diagnosis not present

## 2023-07-12 DIAGNOSIS — R5383 Other fatigue: Secondary | ICD-10-CM | POA: Diagnosis not present

## 2023-07-12 DIAGNOSIS — S0101XA Laceration without foreign body of scalp, initial encounter: Secondary | ICD-10-CM | POA: Diagnosis not present

## 2023-07-12 DIAGNOSIS — S0990XA Unspecified injury of head, initial encounter: Secondary | ICD-10-CM | POA: Diagnosis not present

## 2023-07-12 DIAGNOSIS — Z7401 Bed confinement status: Secondary | ICD-10-CM | POA: Diagnosis not present

## 2023-07-12 DIAGNOSIS — I10 Essential (primary) hypertension: Secondary | ICD-10-CM | POA: Diagnosis not present

## 2023-07-12 DIAGNOSIS — Z79899 Other long term (current) drug therapy: Secondary | ICD-10-CM | POA: Insufficient documentation

## 2023-07-12 DIAGNOSIS — W010XXA Fall on same level from slipping, tripping and stumbling without subsequent striking against object, initial encounter: Secondary | ICD-10-CM | POA: Insufficient documentation

## 2023-07-12 DIAGNOSIS — Z043 Encounter for examination and observation following other accident: Secondary | ICD-10-CM | POA: Diagnosis not present

## 2023-07-12 DIAGNOSIS — F0390 Unspecified dementia without behavioral disturbance: Secondary | ICD-10-CM | POA: Diagnosis not present

## 2023-07-12 DIAGNOSIS — E785 Hyperlipidemia, unspecified: Secondary | ICD-10-CM | POA: Diagnosis not present

## 2023-07-12 NOTE — Discharge Instructions (Addendum)
Please follow-up with your primary care doctor, and get your staples removed in 7 to 10 days.  Return if you feel like your symptoms are worsening, you have a rash, redness, swelling to the area.  You can get the area wet, immediately, and just be careful because it is bruise there.  Your head CT was normal.  If you have intractable headache, nausea, vomiting please return to the ER

## 2023-07-12 NOTE — ED Provider Notes (Signed)
[place] EMERGENCY DEPARTMENT AT [facility] Provider Note   CSN: [id number] Arrival date & time: 07/12/23  1913     History  Chief Complaint  Patient presents with   Lori Ramirez is a 89 y.o. female, history of arthritis, hypertension, who presents to the ED secondary to the fall, that occurred about 2 hours ago.  She states she was walking from the commode to the baseline, and she slipped on some water.  She states she lost her balance, and fell and hit her head.  Denies any trauma, to the back, abdomen, chest, or extremities.  Denies any other pain, states her pain is a 0 out of 10.  Last tetanus was in 2020.  Denies any loss of consciousness, nausea, vomiting, or headache.    Home Medications Prior to Admission medications   Medication Sig Start Date End Date Taking? Authorizing Provider  acetaminophen (TYLENOL) 325 MG tablet Take 650 mg by mouth every 4 (four) hours as needed (for pain).   Yes [provider]  amLODipine (NORVASC) 2.5 MG tablet TAKE 1 TABLET BY MOUTH EVERY DAY Patient taking differently: Take 2.5 mg by mouth in the morning. 02/09/23  Yes Shelva Majestic, MD  calcium gluconate 500 MG tablet Take 500 mg by mouth daily.   Yes [provider]  hydrochlorothiazide (HYDRODIURIL) 25 MG tablet Take 1 tablet (25 mg total) by mouth daily. 04/10/23  Yes Shelva Majestic, MD  Loratadine (CLARITIN) 10 MG CAPS Take 10 mg by mouth in the morning. 07/13/23  Yes [provider]  rivastigmine (EXELON) 3 MG capsule Take 1 capsule (3 mg total) by mouth 2 (two) times daily. 05/03/23  Yes Marcos Eke, PA-C  vitamin B-12 (CYANOCOBALAMIN) 50 MCG tablet Take 50 mcg by mouth in the morning.   Yes [provider]      Allergies    Amoxicillin, Aricept [donepezil], Cortisone acetate, Iodine, Prednisone, and Triamcinolone acetonide    Review of Systems   Review of Systems  Constitutional:  Negative for chills.  HENT:   Negative for sore throat.   Respiratory:  Negative for shortness of breath.   Cardiovascular:  Negative for chest pain.  Gastrointestinal:  Negative for abdominal pain and vomiting.  Musculoskeletal:  Negative for arthralgias and back pain.  Skin:  Positive for wound. Negative for color change and rash.    Physical Exam Updated Vital Signs BP (!) 162/98   Pulse 67   Temp 98.6 F (37 C)   Resp 16   Ht 5\' 1"  (1.549 m)   Wt 65 kg   SpO2 99%   BMI 27.08 kg/m  Physical Exam Vitals and nursing note reviewed.  Constitutional:      General: She is not in acute distress.    Appearance: She is well-developed.  HENT:     Head: Normocephalic and atraumatic.     Right Ear: Tympanic membrane normal. No hemotympanum.     Left Ear: Tympanic membrane normal. No hemotympanum.     Nose: Nose normal.  Eyes:     Conjunctiva/sclera: Conjunctivae normal.  Cardiovascular:     Rate and Rhythm: Normal rate and regular rhythm.     Heart sounds: No murmur heard. Pulmonary:     Effort: Pulmonary effort is normal. No respiratory distress.     Breath sounds: Normal breath sounds.  Abdominal:     Palpations: Abdomen is soft.     Tenderness: There is no abdominal tenderness.  Musculoskeletal:        General: No swelling.     Cervical back: Neck supple.     Comments: 5/5 strength of BUE and BLE. Good ROM. No ttp of chest or abd or hips. All extremities moving.  Skin:    General: Skin is warm and dry.     Capillary Refill: Capillary refill takes less than 2 seconds.     Comments: +3cm linear laceration to posterior scalp. No ecchymoses, crepitus, warmth or wounds to chest/abd/pelvis  Neurological:     Mental Status: She is alert.  Psychiatric:        Mood and Affect: Mood normal.     ED Results / Procedures / Treatments   Labs (all labs ordered are listed, but only abnormal results are displayed) Labs Reviewed - No data to display  EKG None  Radiology CT Head Wo Contrast Result Date:  07/12/2023 CLINICAL DATA:  Fall EXAM: CT HEAD WITHOUT CONTRAST CT CERVICAL SPINE WITHOUT CONTRAST TECHNIQUE: Multidetector CT imaging of the head and cervical spine was performed following the standard protocol without intravenous contrast. Multiplanar CT image reconstructions of the cervical spine were also generated. RADIATION DOSE REDUCTION: This exam was performed according to the departmental dose-optimization program which includes automated exposure control, adjustment of the mA and/or kV according to patient size and/or use of iterative reconstruction technique. COMPARISON:  None Available. FINDINGS: CT HEAD FINDINGS Brain: There is no mass, hemorrhage or extra-axial collection. The size and configuration of the ventricles and extra-axial CSF spaces are normal. There is hypoattenuation of the periventricular white matter, most commonly indicating chronic ischemic microangiopathy. Vascular: Atherosclerotic calcification of the internal carotid arteries at the skull base. No abnormal hyperdensity of the major intracranial arteries or dural venous sinuses. Skull: The visualized skull base, calvarium and extracranial soft tissues are normal. Sinuses/Orbits: No fluid levels or advanced mucosal thickening of the visualized paranasal sinuses. No mastoid or middle ear effusion. The orbits are normal. CT CERVICAL SPINE FINDINGS Alignment: No static subluxation. Facets are aligned. Occipital condyles are normally positioned. Skull base and vertebrae: No acute fracture. Hypertrophic change dorsal to the dens. Soft tissues and spinal canal: No prevertebral fluid or swelling. No visible canal hematoma. Disc levels: No advanced spinal canal or neural foraminal stenosis. Upper chest: No pneumothorax, pulmonary nodule or pleural effusion. Other: Normal visualized paraspinal cervical soft tissues. IMPRESSION: 1. No acute intracranial abnormality. 2. No acute fracture or static subluxation of the cervical spine.  Electronically Signed   By: Deatra Robinson M.D.   On: 07/12/2023 21:25   CT Cervical Spine Wo Contrast Result Date: 07/12/2023 CLINICAL DATA:  Fall EXAM: CT HEAD WITHOUT CONTRAST CT CERVICAL SPINE WITHOUT CONTRAST TECHNIQUE: Multidetector CT imaging of the head and cervical spine was performed following the standard protocol without intravenous contrast. Multiplanar CT image reconstructions of the cervical spine were also generated. RADIATION DOSE REDUCTION: This exam was performed according to the departmental dose-optimization program which includes automated exposure control, adjustment of the mA and/or kV according to patient size and/or use of iterative reconstruction technique. COMPARISON:  None Available. FINDINGS: CT HEAD FINDINGS Brain: There is no mass, hemorrhage or extra-axial collection. The size and configuration of the ventricles and extra-axial CSF spaces are normal. There is hypoattenuation of the periventricular white matter, most commonly indicating chronic ischemic microangiopathy. Vascular: Atherosclerotic calcification of the internal carotid arteries at the skull base. No abnormal hyperdensity of the major intracranial arteries or dural venous sinuses. Skull: The visualized skull base, calvarium and  extracranial soft tissues are normal. Sinuses/Orbits: No fluid levels or advanced mucosal thickening of the visualized paranasal sinuses. No mastoid or middle ear effusion. The orbits are normal. CT CERVICAL SPINE FINDINGS Alignment: No static subluxation. Facets are aligned. Occipital condyles are normally positioned. Skull base and vertebrae: No acute fracture. Hypertrophic change dorsal to the dens. Soft tissues and spinal canal: No prevertebral fluid or swelling. No visible canal hematoma. Disc levels: No advanced spinal canal or neural foraminal stenosis. Upper chest: No pneumothorax, pulmonary nodule or pleural effusion. Other: Normal visualized paraspinal cervical soft tissues.  IMPRESSION: 1. No acute intracranial abnormality. 2. No acute fracture or static subluxation of the cervical spine. Electronically Signed   By: Deatra Robinson M.D.   On: 07/12/2023 21:25    Procedures .Laceration Repair  Date/Time: 07/12/2023 9:53 PM  Performed by: Pete Pelt, PA Authorized by: Pete Pelt, PA   Consent:    Consent obtained:  Verbal   Consent given by:  Patient   Risks, benefits, and alternatives were discussed: yes     Risks discussed:  Infection, need for additional repair, nerve damage, poor wound healing, poor cosmetic result, pain, retained foreign body, tendon damage and vascular damage   Alternatives discussed:  No treatment Universal protocol:    Patient identity confirmed:  Verbally with patient and arm band Anesthesia:    Anesthesia method:  None Laceration details:    Location:  Scalp   Scalp location:  Occipital   Length (cm):  3 Treatment:    Area cleansed with:  Chlorhexidine   Amount of cleaning:  Standard   Irrigation solution:  Sterile saline   Irrigation method:  Pressure wash Skin repair:    Repair method:  Staples   Number of staples:  6 Approximation:    Approximation:  Close Repair type:    Repair type:  Simple Post-procedure details:    Dressing:  Open (no dressing)     Medications Ordered in ED Medications - No data to display  ED Course/ Medical Decision Making/ A&P                                 Medical Decision Making Patient is a 89 year old female, here for fall, that occurred when she tried to go to the bathroom.  She states that she has no pain, she has no ecchymosis, the abdomen, chest, or back.  No tenderness to palpation.  She is in a c-collar, has a large wound, to her scalp.  Will obtain a CT head, and neck, for further evaluation.  Tetanus is up-to-date.  I performed a laceration repair, see procedures  Amount and/or Complexity of Data Reviewed Radiology: ordered.    Details: CT head, neck,  unremarkable no acute findings Discussion of management or test interpretation with external provider(s): Patient overall well-appearing, CT head/neck unremarkable.  No evidence of ecchymosis, of the chest, abdomen, pelvis.  She is not on any blood thinners.  I repaired her laceration to her scalp, and there was good hemostasis.  She was discharged home with strict return precautions    Final Clinical Impression(s) / ED Diagnoses Final diagnoses:  Injury of head, initial encounter  Laceration of scalp, initial encounter  Fall, initial encounter    Rx / DC Orders ED Discharge Orders     None         Adaliah Hiegel, Harley [place], PA 07/12/23 2344    Coral Spikes,  DO 07/12/23 2350

## 2023-07-12 NOTE — ED Notes (Addendum)
Patient very confuse and unable to redirect. Patient keep getting out in the bed. RN and tech activated bed alarm. Posey belt applied.

## 2023-07-12 NOTE — ED Notes (Signed)
Attempted to call husband for update x2. . No response

## 2023-07-12 NOTE — ED Notes (Signed)
Attempted to call [facility] for report x2. Will try again later.

## 2023-07-12 NOTE — ED Notes (Signed)
Patient got up in the bed without assistance. Patient loss her balance and almost fell in the bed. RN able to hold patient and move her in the bed. Will continue to monitor.

## 2023-07-12 NOTE — ED Triage Notes (Signed)
Patient BIB EMS from SNF([facility]) / c/o unwitnessed fall. Per report fell on her back and hit her head in the floor. Patient sustain laceration in the back of her head.  Patient not on blood thinners. Patient denies LOC. Patient denies N/V. Patient

## 2023-07-13 ENCOUNTER — Encounter: Payer: Self-pay | Admitting: Physician Assistant

## 2023-07-31 DIAGNOSIS — F4323 Adjustment disorder with mixed anxiety and depressed mood: Secondary | ICD-10-CM | POA: Diagnosis not present

## 2023-07-31 DIAGNOSIS — F0390 Unspecified dementia without behavioral disturbance: Secondary | ICD-10-CM | POA: Diagnosis not present

## 2023-08-02 ENCOUNTER — Encounter: Payer: Self-pay | Admitting: Internal Medicine

## 2023-08-03 ENCOUNTER — Encounter: Payer: Self-pay | Admitting: Physician Assistant

## 2023-08-03 ENCOUNTER — Ambulatory Visit: Payer: Medicare PPO | Admitting: Physician Assistant

## 2023-08-03 VITALS — BP 155/54 | HR 86 | Resp 18 | Ht 61.0 in | Wt 146.0 lb

## 2023-08-03 DIAGNOSIS — F0390 Unspecified dementia without behavioral disturbance: Secondary | ICD-10-CM | POA: Diagnosis not present

## 2023-08-03 NOTE — Progress Notes (Signed)
 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 RH female with a history of dementia likely due to Alzheimer's disease, initially seen on 12/2020, but losing to follow up. She then returned in October 2024 in follow up.   MoCA was unable to be performed. MRI brain personally reviewed shows moderate to advanced atrophy, cerebral volume loss and  severe chronic  microvascular changes, with chronic microhemorrhages in B deep gray nuclei and B mesial temporal lobe. Patient now is on memory care at [facility] facility for cognitive and social stimulation and safety. Memory decline is noted even from prior visit.  Unable to perform MMSE. Patient is currently on rivastigmine  3 mg twice daily. Husband and daughter would prefer discontinuing the therapy as this is no longer therapeutic and risks outweigh benefits. Agree with the plan. They are in the process of getting DNR for patient.    No follow up is indicated  Agree with discontinuing rivastigmine  as it is no longer therapeutic Recommend good control of her cardiovascular risk factors Continue to control mood as per PCP    Subjective:    This patient is accompanied in the office by her husband and daughter  who supplement the history.  Previous records as well as any outside records available were reviewed prior to todays visit. Patient was last seen on 05/03/2023 with MMSE 25/30.    Any changes in memory since last visit?  About the same.  She now lives at memory care at [facility] since Christmas eve, and doing the activities at the facility. Everything is more extreme than before.  repeats oneself?  Endorsed,  frequently, all she says I am going to go home. Disoriented when walking into a room?  Patient denies    Leaving objects?  May misplace things but not in unusual places   Wandering behavior?  denies   Any personality changes since last visit?  denies    Any worsening depression?:  Denies.   Hallucinations or paranoia?  Denies.   Seizures? denies    Any sleep changes?  Sleeps well. Denies vivid dreams, REM behavior or sleepwalking   Sleep apnea?   Denies.   Any hygiene concerns? She does not like to shower or wash her hair Independent of bathing and dressing?  Endorsed  Does the patient needs help with medications?  Facility is in charge   Who is in charge of the finances?  Daughter in charge     Any changes in appetite?  denies     Patient have trouble swallowing? Denies.   Does the patient cook? No Any headaches?   denies   Chronic back pain  denies   Ambulates with difficulty?  She uses walker to ambulate for stability and preventing falls  Recent falls or head injuries?  On 07/12/2023 she had a mechanical fall as she was walking from the commode to the bed and she slipped on some water, hitting her head without LOC, nausea vomiting or headaches.  She was seen at the ED, and a laceration to the posterior scalp was performed.  CT of the head and C-spine were without acute abnormalities or fractures Unilateral weakness, numbness or tingling? denies   Any tremors?  Denies   Any anosmia?  Denies   Any incontinence of urine?  Endorsed, wears diapers, she had 3 UTIs in 1.5 year  Any bowel dysfunction?   Denies  Patient lives at [facility] memory care  Does the patient drive? No longer drives    Initial visit 01/07/21     The patient is seen in neurologic consultation at the request of Katrinka Garnette KIDD, MD for the evaluation of memory.  The patient is accompanied by husband [name] who supplements the history.   The patient is a 89 y.o. year old female who has had memory issues for about  2 years. Dr. Katrinka had recommended Neuro evaluation at the end of 2020, but she declined until now, when her memory loss has become more noticeable by her husband [name]. Back then, was 28/30.  A repeat at Dr. Carolee office yielded a 26/30.   Although she feels that her memory is fair, her husband reports that she cannot remember places, or people that she actually knew.  She claims this is due to a lack of interaction due to the pandemic.  However, her husband is 67 years, reports that she is now having significant cognitive decline, especially over the last 6 months.  He states that she is highly educated, very smart, and very well travelled.  She is a retired english as a second language teacher.  Husband states that now, she has become very annoying , she stays in the same room, where she states 80% of the time, pointing at things that are not there, or talks about meaningless issues such as why is in the squirrel outside, or why are these golfers in my backyard, why those two women looking at me etc.  She reports being okay, and that her mood is nothing , denying depression or irritability.  She denies hallucinations or paranoia, or vivid dreams or sleepwalking.  He reports that her long-term memory is still good.   She now has to bathe with the help of her husband, who has to dry her, put her underwear on, and her shoes on.  She no longer takes her medications by herself.  Her husband places daily and takes them in front of her husband every morning and every afternoon.  She misplaces, objects, such as dipstick, but denies placing them in unusual locations.  Her husband has taken over the finances, and lately the cooking.  She is to be a good cook, but ever since she left the stove on about 3 months ago, she is only dedicated to making a salad.  She is to know very well her recipes, and now she cannot read or follow those instructions if these recipes were written.  She no longer drives.  She has been ambulating with difficulty due to arthritis. Repeatedly, she has declined PT evaluation as recommended by her PCP, because it was not helping for when I had my hip surgery .  She has several mechanical falls over the last year without head injury, but  admits that her balance is off as well, especially with a cane.  She is mostly now ambulating with a walker.   Denies headaches, trauma, or injuries to the head, double vision, dizziness, focal numbness or tingling, unilateral weakness or tremors.  She has urine incontinence, denies retention.  She denies any constipation or diarrhea.  Denies any history of OSA, alcohol or tobacco.  Family history negative for Alzheimer's disease.     Last labs performed on 07/02/2020 showed total cholesterol of 261, with LDL of 127, elevated.  Normal HDL and TG.;  PREVIOUS MEDICATIONS:   CURRENT MEDICATIONS:  Outpatient Encounter Medications as of 08/03/2023  Medication Sig   acetaminophen  (TYLENOL )  325 MG tablet Take 650 mg by mouth every 4 (four) hours as needed (for pain).   amLODipine  (NORVASC ) 2.5 MG tablet TAKE 1 TABLET BY MOUTH EVERY DAY (Patient taking differently: Take 2.5 mg by mouth in the morning.)   calcium gluconate 500 MG tablet Take 500 mg by mouth daily.   hydrochlorothiazide  (HYDRODIURIL ) 25 MG tablet Take 1 tablet (25 mg total) by mouth daily.   Loratadine (CLARITIN) 10 MG CAPS Take 10 mg by mouth in the morning.   rivastigmine  (EXELON ) 3 MG capsule Take 1 capsule (3 mg total) by mouth 2 (two) times daily.   vitamin B-12 (CYANOCOBALAMIN ) 50 MCG tablet Take 50 mcg by mouth in the morning.   No facility-administered encounter medications on file as of 08/03/2023.       05/03/2023    5:00 PM 07/02/2019    9:32 AM 01/17/2017    8:30 AM  MMSE - Mini Mental State Exam  Orientation to time 1 5 5   Orientation to Place 5 4 5   Registration 3 3 3   Attention/ Calculation 5 5 5   Recall 2 2 1   Language- name 2 objects 2 2 2   Language- repeat 1 1 1   Language- follow 3 step command 3 3 3   Language- read & follow direction 1 1 1   Write a sentence 1 1 1   Copy design 1 1 1   Total score 25 28 28       01/07/2021    2:00 PM  Montreal Cognitive Assessment   Visuospatial/ Executive (0/5) 0  Naming  (0/3) 3  Attention: Read list of digits (0/2) 2  Attention: Read list of letters (0/1) 1  Attention: Serial 7 subtraction starting at 100 (0/3) 2  Language: Repeat phrase (0/2) 0  Language : Fluency (0/1) 0  Abstraction (0/2) 0  Delayed Recall (0/5) 0  Orientation (0/6) 4  Total 12  Adjusted Score (based on education) 12    Objective:     PHYSICAL EXAMINATION:    VITALS:   Vitals:   08/03/23 1247 08/03/23 1344  BP: (!) 172/78 (!) 155/54  Pulse: 86   Resp: 18   Weight: 146 lb (66.2 kg)   Height: 5' 1 (1.549 m)     GEN:  The patient appears stated age and is in NAD. HEENT:  Normocephalic, atraumatic.   Neurological examination:  General: NAD, well-groomed, appears stated age. Orientation: The patient is alert. Oriented to person, not to place and not to date Cranial nerves: There is good facial symmetry.f lat affect.  The speech is fluent and clear. No aphasia or dysarthria. Fund of knowledge is reduced  Recent and remote memory are impaired. Attention and concentration are reduced.  Able to name objects and repeat phrases.  Hearing is intact to conversational tone.   Sensation: Sensation is intact to light touch throughout Motor: Strength is at least antigravity x4. DTR's 2/4 in UE/LE     Movement examination: Tone: There is normal tone in the UE/LE Abnormal movements:  no tremor.  No myoclonus.  No asterixis.   Coordination:  There is mild  decremation with RAM's. Abnormal finger to nose  Gait and Station: The patient has some  difficulty arising out of a deep-seated chair without the use of the hands. Needs walker to ambulate. The patient's stride length is good.  Gait is cautious and narrow.    Thank you for allowing us  the opportunity to participate in the care of this nice patient. Please do not hesitate to  contact us  for any questions or concerns.   Total time spent on today's visit was 38 minutes dedicated to this patient today, preparing to see patient,  examining the patient, ordering tests and/or medications and counseling the patient, documenting clinical information in the EHR or other health record, independently interpreting results and communicating results to the patient/family, discussing treatment and goals, answering patient's questions and coordinating care.  Cc:  Caleen Dirks, MD  Camie Sevin 08/03/2023 2:31 PM

## 2023-08-07 ENCOUNTER — Encounter: Payer: Self-pay | Admitting: Physician Assistant

## 2023-08-07 ENCOUNTER — Encounter: Payer: Self-pay | Admitting: Internal Medicine

## 2023-08-07 ENCOUNTER — Telehealth: Payer: Self-pay | Admitting: Physician Assistant

## 2023-08-07 NOTE — Telephone Encounter (Signed)
 My chart message was already sent as well. Will call shortly back.

## 2023-08-07 NOTE — Telephone Encounter (Signed)
 Patient Daughter wants to speak with someone about a letter that states her mother is not able to make decision for her financially  please call

## 2023-08-15 ENCOUNTER — Encounter: Payer: Self-pay | Admitting: Physician Assistant

## 2023-08-16 ENCOUNTER — Other Ambulatory Visit: Payer: Self-pay | Admitting: Physician Assistant

## 2023-08-17 NOTE — Progress Notes (Signed)
I sent fax to [facility] stone to Dc Rivastigmine per Jerelyn Charles [phone]

## 2023-08-21 NOTE — Telephone Encounter (Signed)
Caller stated he needs to speak with Lori Ramirez concerning paper work that needed to be faxed over

## 2023-08-22 NOTE — Telephone Encounter (Signed)
Tried to call, send my chart message

## 2023-08-25 ENCOUNTER — Encounter: Payer: Medicare PPO | Admitting: Family Medicine

## 2023-08-28 ENCOUNTER — Encounter: Payer: Self-pay | Admitting: Internal Medicine

## 2023-08-28 DIAGNOSIS — F0390 Unspecified dementia without behavioral disturbance: Secondary | ICD-10-CM | POA: Diagnosis not present

## 2023-09-07 NOTE — Telephone Encounter (Signed)
Can you please set up her annual with amin?

## 2023-09-25 DIAGNOSIS — F0390 Unspecified dementia without behavioral disturbance: Secondary | ICD-10-CM | POA: Diagnosis not present

## 2023-10-03 ENCOUNTER — Encounter: Payer: Self-pay | Admitting: Internal Medicine

## 2023-10-03 ENCOUNTER — Ambulatory Visit: Payer: Medicare PPO | Admitting: Internal Medicine

## 2023-10-03 ENCOUNTER — Other Ambulatory Visit: Payer: Self-pay | Admitting: Internal Medicine

## 2023-10-03 VITALS — BP 154/74 | HR 81 | Temp 97.5°F | Resp 18 | Ht 61.0 in

## 2023-10-03 DIAGNOSIS — R600 Localized edema: Secondary | ICD-10-CM | POA: Diagnosis not present

## 2023-10-03 DIAGNOSIS — Z6827 Body mass index (BMI) 27.0-27.9, adult: Secondary | ICD-10-CM | POA: Diagnosis not present

## 2023-10-03 DIAGNOSIS — Z0000 Encounter for general adult medical examination without abnormal findings: Secondary | ICD-10-CM | POA: Diagnosis not present

## 2023-10-03 DIAGNOSIS — I10 Essential (primary) hypertension: Secondary | ICD-10-CM | POA: Diagnosis not present

## 2023-10-03 DIAGNOSIS — R296 Repeated falls: Secondary | ICD-10-CM | POA: Insufficient documentation

## 2023-10-03 DIAGNOSIS — F01B0 Vascular dementia, moderate, without behavioral disturbance, psychotic disturbance, mood disturbance, and anxiety: Secondary | ICD-10-CM | POA: Diagnosis not present

## 2023-10-03 DIAGNOSIS — E782 Mixed hyperlipidemia: Secondary | ICD-10-CM | POA: Diagnosis not present

## 2023-10-03 MED ORDER — AMLODIPINE BESYLATE 5 MG PO TABS: 5.0000 mg | ORAL_TABLET | Freq: Every day | ORAL | 6 refills | Status: AC

## 2023-10-03 MED ORDER — FUROSEMIDE 20 MG PO TABS: 20.0000 mg | ORAL_TABLET | Freq: Every day | ORAL | 0 refills | Status: AC

## 2023-10-03 NOTE — Assessment & Plan Note (Addendum)
 I have stopped her rosuvastatin because of her age. Per husband, she is doing better since that medicine was stopped.

## 2023-10-03 NOTE — Assessment & Plan Note (Signed)
 I will start her on lasix 20 mg daily for 2 weeks.

## 2023-10-03 NOTE — Assessment & Plan Note (Signed)
 I have discussed PT but husband does not think that will be helpful. She loves to do Yoga there

## 2023-10-03 NOTE — Progress Notes (Signed)
 Office Visit  Subjective   Patient ID: Lori Ramirez Duecker   DOB: 07-28-1930   Age: 89 y.o.   MRN: [id number]   Chief Complaint Chief Complaint  Patient presents with   Annual Exam    Medicare annual exam     History of Present Illness 89 years old female is here for annual wellness examination. She is in dementia unit at [facility] memory unit. She walk with walker and rolator that she goes to the bathroom. She fell three times. She can feed herself. She does not drive. She score 13/30 on MMSE. She did not tolerated exelon as that did not made any difference so that was stopped.   She never has flu, pneumonia or shingle vaccine.    She has hypertension,her blood pressure is slightly high.   She has edema in her legs. Her last lipid panel done was 07/2022.   She also has dermatitis on her right cheek and her husband is asking if anything can be done.    Past Medical History Past Medical History:  Diagnosis Date   Arthritis    Essential hypertension 05/07/2015   Obesity (BMI 30-39.9) 05/07/2015   Osteoarthritis of left hip      Allergies Allergies  Allergen Reactions   Amoxicillin Other (See Comments)    Not noted on the [facility]   Aricept [Donepezil] Other (See Comments)    Severe, debilitating chills   Cortisone Acetate Rash   Iodine Rash   Prednisone Rash   Triamcinolone Acetonide Rash     Review of Systems Review of Systems  Constitutional: Negative.   HENT: Negative.    Respiratory: Negative.    Cardiovascular:  Positive for leg swelling.  Gastrointestinal: Negative.   Neurological: Negative.        Objective:    Vitals BP (!) 154/74 (BP Location: Left Arm, Patient Position: Sitting, Cuff Size: Normal)   Pulse 81   Temp (!) 97.5 F (36.4 C)   Resp 18   Ht 5\' 1"  (1.549 m)   SpO2 97%   BMI 27.59 kg/m    Physical Examination Physical Exam Constitutional:      Appearance: Normal appearance.  HENT:     Head: Normocephalic and atraumatic.   Cardiovascular:     Rate and Rhythm: Normal rate and regular rhythm.     Heart sounds: Normal heart sounds.  Pulmonary:     Effort: Pulmonary effort is normal.  Abdominal:     General: Bowel sounds are normal.     Palpations: Abdomen is soft.  Neurological:     General: No focal deficit present.     Mental Status: She is alert. Mental status is at baseline.        Assessment & Plan:   Moderate vascular dementia without behavioral disturbance, psychotic disturbance, mood disturbance, or anxiety (HCC) Her husband does not want any medicine. Her short term memory is worse but long term memory is still good. Her husband will try to do cross puzzles.  Hyperlipidemia I have stopped her rosuvastatin because of her age. Per husband, she is doing better since that medicine was stopped.   Lower leg edema I will start her on lasix 20 mg daily for 2 weeks.  Recurrent falls while walking I have discussed PT but husband does not think that will be helpful. She loves to do Yoga there  Essential hypertension Her blood pressure is high, I will increase amlodipine to 5 mg daily    No follow-ups on file.  Eloisa Northern, MD

## 2023-10-03 NOTE — Assessment & Plan Note (Addendum)
 Her husband does not want any medicine. Her short term memory is worse but long term memory is still good. Her husband will try to do cross puzzles.

## 2023-10-03 NOTE — Assessment & Plan Note (Signed)
 Her blood pressure is high, I will increase amlodipine to 5 mg daily

## 2023-10-06 LAB — COMPREHENSIVE METABOLIC PANEL
ALT: 9 IU/L (ref 0–32)
AST: 18 IU/L (ref 0–40)
Albumin: 4.4 g/dL (ref 3.6–4.6)
Alkaline Phosphatase: 89 IU/L (ref 44–121)
BUN/Creatinine Ratio: 27 (ref 12–28)
BUN: 35 mg/dL (ref 10–36)
Bilirubin Total: 0.3 mg/dL (ref 0.0–1.2)
CO2: 25 mmol/L (ref 20–29)
Calcium: 9.8 mg/dL (ref 8.7–10.3)
Chloride: 104 mmol/L (ref 96–106)
Creatinine, Ser: 1.28 mg/dL — ABNORMAL HIGH (ref 0.57–1.00)
Globulin, Total: 2.3 g/dL (ref 1.5–4.5)
Glucose: 97 mg/dL (ref 70–99)
Potassium: 4.4 mmol/L (ref 3.5–5.2)
Sodium: 144 mmol/L (ref 134–144)
Total Protein: 6.7 g/dL (ref 6.0–8.5)
eGFR: 40 mL/min/{1.73_m2} — ABNORMAL LOW (ref >59)

## 2023-10-06 LAB — SPECIMEN STATUS REPORT

## 2023-10-09 ENCOUNTER — Encounter: Payer: Self-pay | Admitting: Internal Medicine

## 2023-10-11 NOTE — Progress Notes (Signed)
 Patient called.  Patient aware. Labs ok, will monitor renal function Spoke to Lori Ramirez patient husband. Patient is in memory Care.

## 2023-10-20 DIAGNOSIS — R2242 Localized swelling, mass and lump, left lower limb: Secondary | ICD-10-CM | POA: Diagnosis not present

## 2023-10-23 DIAGNOSIS — I69398 Other sequelae of cerebral infarction: Secondary | ICD-10-CM | POA: Diagnosis not present

## 2023-10-23 DIAGNOSIS — G301 Alzheimer's disease with late onset: Secondary | ICD-10-CM | POA: Diagnosis not present

## 2023-10-23 DIAGNOSIS — F01C4 Vascular dementia, severe, with anxiety: Secondary | ICD-10-CM | POA: Diagnosis not present

## 2023-10-23 DIAGNOSIS — F331 Major depressive disorder, recurrent, moderate: Secondary | ICD-10-CM | POA: Diagnosis not present

## 2023-10-23 DIAGNOSIS — F02C18 Dementia in other diseases classified elsewhere, severe, with other behavioral disturbance: Secondary | ICD-10-CM | POA: Diagnosis not present

## 2023-10-24 ENCOUNTER — Ambulatory Visit: Admitting: Internal Medicine

## 2023-10-24 ENCOUNTER — Encounter: Payer: Self-pay | Admitting: Internal Medicine

## 2023-10-24 VITALS — BP 122/68 | HR 94 | Temp 97.4°F | Resp 18 | Ht 60.0 in | Wt 145.0 lb

## 2023-10-24 DIAGNOSIS — I10 Essential (primary) hypertension: Secondary | ICD-10-CM

## 2023-10-24 DIAGNOSIS — R600 Localized edema: Secondary | ICD-10-CM

## 2023-10-24 DIAGNOSIS — F321 Major depressive disorder, single episode, moderate: Secondary | ICD-10-CM | POA: Insufficient documentation

## 2023-10-24 NOTE — Assessment & Plan Note (Signed)
 She was started on Prozac 10 mg and buspirone yesterday and psych NP will continue to follow.

## 2023-10-24 NOTE — Assessment & Plan Note (Signed)
 We will apply Ace bandage both lower extremity during daytime.

## 2023-10-24 NOTE — Assessment & Plan Note (Signed)
 Blood pressure is controlled

## 2023-10-24 NOTE — Progress Notes (Signed)
 Office Visit  Subjective   Patient ID: Lori Ramirez   DOB: 1931/06/05   Age: 89 y.o.   MRN: [id number]   Chief Complaint Chief Complaint  Patient presents with   office visit     History of Present Illness 89 years old female is here with her daughter for acute visit on the request of her daughter.  She is accompanied by her husband and daughter for this visit.  Per daughter she has increased swelling in her legs and her legs were weeping.  I have ordered venous Doppler and that was negative.  I have suggested staff to apply Ace bandage during daytime.  Her swelling is little better today.  He is 89 years old lady with dementia who sits most of the time on a wheelchair.  Per daughter she is also depressed.  She was seen by psych nurse practitioner yesterday who started her on Prozac and milligrams daily and buspirone 5 mg 3 times a day for anxiety.  It was just started yesterday show will see the effect in 3 weeks time and psych will continue to follow.  She has hypertension and her blood pressure was elevated last time.  She is on amlodipine 2.5 and hydrochlorothiazide daily.  Her blood pressure is well-controlled today.  She has a history of moderate to advanced dementia where she recognize her daughter by name and her husband.  But she has a difficulty calling her daughter if she need anything.  Daughter have asked the assisted living staff if they will allow her so she can put a camera in the room so she can monitor her.  Past Medical History Past Medical History:  Diagnosis Date   Arthritis    Essential hypertension 05/07/2015   Obesity (BMI 30-39.9) 05/07/2015   Osteoarthritis of left hip      Allergies Allergies  Allergen Reactions   Amoxicillin Other (See Comments)    Not noted on the [facility]   Aricept [Donepezil] Other (See Comments)    Severe, debilitating chills   Cortisone Acetate Rash   Iodine Rash   Prednisone Rash   Triamcinolone Acetonide Rash     Review of  Systems Review of Systems  Constitutional: Negative.   Respiratory: Negative.    Cardiovascular:  Positive for leg swelling.  Psychiatric/Behavioral:  Positive for depression and memory loss.        Objective:    Vitals BP 122/68 (BP Location: Left Arm, Patient Position: Sitting, Cuff Size: Normal)   Pulse 94   Temp (!) 97.4 F (36.3 C)   Resp 18   Ht 5' (1.524 m)   Wt 145 lb (65.8 kg)   SpO2 97%   BMI 28.32 kg/m    Physical Examination Physical Exam Constitutional:      Appearance: Normal appearance.  HENT:     Head: Normocephalic and atraumatic.  Cardiovascular:     Rate and Rhythm: Normal rate and regular rhythm.  Pulmonary:     Breath sounds: Normal breath sounds.  Abdominal:     General: Bowel sounds are normal.     Palpations: Abdomen is soft.  Musculoskeletal:     Right lower leg: Edema present.     Left lower leg: Edema present.  Neurological:     General: No focal deficit present.     Mental Status: She is alert.        Assessment & Plan:   Essential hypertension Blood pressure is controlled.  Lower leg edema We will apply  Ace bandage both lower extremity during daytime.  Current moderate episode of major depressive disorder without prior episode (HCC) She was started on Prozac 10 mg and buspirone yesterday and psych NP will continue to follow.    No follow-ups on file.   Eloisa Northern, MD

## 2023-11-06 DIAGNOSIS — F02C18 Dementia in other diseases classified elsewhere, severe, with other behavioral disturbance: Secondary | ICD-10-CM | POA: Diagnosis not present

## 2023-11-06 DIAGNOSIS — F331 Major depressive disorder, recurrent, moderate: Secondary | ICD-10-CM | POA: Diagnosis not present

## 2023-11-06 DIAGNOSIS — I69398 Other sequelae of cerebral infarction: Secondary | ICD-10-CM | POA: Diagnosis not present

## 2023-11-06 DIAGNOSIS — F01C4 Vascular dementia, severe, with anxiety: Secondary | ICD-10-CM | POA: Diagnosis not present

## 2023-11-06 DIAGNOSIS — G301 Alzheimer's disease with late onset: Secondary | ICD-10-CM | POA: Diagnosis not present

## 2023-11-20 DIAGNOSIS — F331 Major depressive disorder, recurrent, moderate: Secondary | ICD-10-CM | POA: Diagnosis not present

## 2023-11-20 DIAGNOSIS — G301 Alzheimer's disease with late onset: Secondary | ICD-10-CM | POA: Diagnosis not present

## 2023-11-20 DIAGNOSIS — I69398 Other sequelae of cerebral infarction: Secondary | ICD-10-CM | POA: Diagnosis not present

## 2023-11-20 DIAGNOSIS — F01C4 Vascular dementia, severe, with anxiety: Secondary | ICD-10-CM | POA: Diagnosis not present

## 2023-11-20 DIAGNOSIS — F02C18 Dementia in other diseases classified elsewhere, severe, with other behavioral disturbance: Secondary | ICD-10-CM | POA: Diagnosis not present

## 2023-12-04 DIAGNOSIS — F411 Generalized anxiety disorder: Secondary | ICD-10-CM | POA: Diagnosis not present

## 2023-12-19 DIAGNOSIS — F331 Major depressive disorder, recurrent, moderate: Secondary | ICD-10-CM | POA: Diagnosis not present

## 2023-12-19 DIAGNOSIS — F411 Generalized anxiety disorder: Secondary | ICD-10-CM | POA: Diagnosis not present

## 2023-12-19 DIAGNOSIS — G309 Alzheimer's disease, unspecified: Secondary | ICD-10-CM | POA: Diagnosis not present

## 2023-12-26 DIAGNOSIS — M6281 Muscle weakness (generalized): Secondary | ICD-10-CM | POA: Diagnosis not present

## 2023-12-26 DIAGNOSIS — R278 Other lack of coordination: Secondary | ICD-10-CM | POA: Diagnosis not present

## 2023-12-26 DIAGNOSIS — F0390 Unspecified dementia without behavioral disturbance: Secondary | ICD-10-CM | POA: Diagnosis not present

## 2024-01-15 DIAGNOSIS — I69398 Other sequelae of cerebral infarction: Secondary | ICD-10-CM | POA: Diagnosis not present

## 2024-01-15 DIAGNOSIS — F02C18 Dementia in other diseases classified elsewhere, severe, with other behavioral disturbance: Secondary | ICD-10-CM | POA: Diagnosis not present

## 2024-01-15 DIAGNOSIS — F331 Major depressive disorder, recurrent, moderate: Secondary | ICD-10-CM | POA: Diagnosis not present

## 2024-01-15 DIAGNOSIS — G309 Alzheimer's disease, unspecified: Secondary | ICD-10-CM | POA: Diagnosis not present

## 2024-01-15 DIAGNOSIS — F411 Generalized anxiety disorder: Secondary | ICD-10-CM | POA: Diagnosis not present

## 2024-01-15 DIAGNOSIS — F01C4 Vascular dementia, severe, with anxiety: Secondary | ICD-10-CM | POA: Diagnosis not present

## 2024-02-12 DIAGNOSIS — F01C4 Vascular dementia, severe, with anxiety: Secondary | ICD-10-CM | POA: Diagnosis not present

## 2024-02-12 DIAGNOSIS — G309 Alzheimer's disease, unspecified: Secondary | ICD-10-CM | POA: Diagnosis not present

## 2024-02-12 DIAGNOSIS — F331 Major depressive disorder, recurrent, moderate: Secondary | ICD-10-CM | POA: Diagnosis not present

## 2024-02-12 DIAGNOSIS — F02C18 Dementia in other diseases classified elsewhere, severe, with other behavioral disturbance: Secondary | ICD-10-CM | POA: Diagnosis not present

## 2024-02-12 DIAGNOSIS — I69398 Other sequelae of cerebral infarction: Secondary | ICD-10-CM | POA: Diagnosis not present

## 2024-02-19 DIAGNOSIS — Z4689 Encounter for fitting and adjustment of other specified devices: Secondary | ICD-10-CM | POA: Diagnosis not present

## 2024-02-19 DIAGNOSIS — I10 Essential (primary) hypertension: Secondary | ICD-10-CM | POA: Diagnosis not present

## 2024-02-26 DIAGNOSIS — I69398 Other sequelae of cerebral infarction: Secondary | ICD-10-CM | POA: Diagnosis not present

## 2024-02-26 DIAGNOSIS — F01C4 Vascular dementia, severe, with anxiety: Secondary | ICD-10-CM | POA: Diagnosis not present

## 2024-02-26 DIAGNOSIS — F331 Major depressive disorder, recurrent, moderate: Secondary | ICD-10-CM | POA: Diagnosis not present

## 2024-02-26 DIAGNOSIS — F02C18 Dementia in other diseases classified elsewhere, severe, with other behavioral disturbance: Secondary | ICD-10-CM | POA: Diagnosis not present

## 2024-02-26 DIAGNOSIS — F411 Generalized anxiety disorder: Secondary | ICD-10-CM | POA: Diagnosis not present

## 2024-02-26 DIAGNOSIS — G309 Alzheimer's disease, unspecified: Secondary | ICD-10-CM | POA: Diagnosis not present

## 2024-03-11 DIAGNOSIS — F01C4 Vascular dementia, severe, with anxiety: Secondary | ICD-10-CM | POA: Diagnosis not present

## 2024-03-11 DIAGNOSIS — F331 Major depressive disorder, recurrent, moderate: Secondary | ICD-10-CM | POA: Diagnosis not present

## 2024-03-11 DIAGNOSIS — F02C18 Dementia in other diseases classified elsewhere, severe, with other behavioral disturbance: Secondary | ICD-10-CM | POA: Diagnosis not present

## 2024-03-11 DIAGNOSIS — I69398 Other sequelae of cerebral infarction: Secondary | ICD-10-CM | POA: Diagnosis not present

## 2024-03-11 DIAGNOSIS — F411 Generalized anxiety disorder: Secondary | ICD-10-CM | POA: Diagnosis not present

## 2024-03-11 DIAGNOSIS — G309 Alzheimer's disease, unspecified: Secondary | ICD-10-CM | POA: Diagnosis not present

## 2024-03-13 DIAGNOSIS — F339 Major depressive disorder, recurrent, unspecified: Secondary | ICD-10-CM | POA: Diagnosis not present

## 2024-03-13 DIAGNOSIS — F419 Anxiety disorder, unspecified: Secondary | ICD-10-CM | POA: Diagnosis not present

## 2024-03-13 DIAGNOSIS — I10 Essential (primary) hypertension: Secondary | ICD-10-CM | POA: Diagnosis not present

## 2024-03-13 DIAGNOSIS — I872 Venous insufficiency (chronic) (peripheral): Secondary | ICD-10-CM | POA: Diagnosis not present

## 2024-03-14 DIAGNOSIS — Z130 Encounter for screening for diseases of the blood and blood-forming organs and certain disorders involving the immune mechanism: Secondary | ICD-10-CM | POA: Diagnosis not present

## 2024-03-14 DIAGNOSIS — Z79899 Other long term (current) drug therapy: Secondary | ICD-10-CM | POA: Diagnosis not present

## 2024-04-22 DIAGNOSIS — F02C18 Dementia in other diseases classified elsewhere, severe, with other behavioral disturbance: Secondary | ICD-10-CM | POA: Diagnosis not present

## 2024-04-22 DIAGNOSIS — G309 Alzheimer's disease, unspecified: Secondary | ICD-10-CM | POA: Diagnosis not present

## 2024-04-22 DIAGNOSIS — I69398 Other sequelae of cerebral infarction: Secondary | ICD-10-CM | POA: Diagnosis not present

## 2024-04-22 DIAGNOSIS — F331 Major depressive disorder, recurrent, moderate: Secondary | ICD-10-CM | POA: Diagnosis not present

## 2024-04-22 DIAGNOSIS — F411 Generalized anxiety disorder: Secondary | ICD-10-CM | POA: Diagnosis not present

## 2024-04-22 DIAGNOSIS — F01C4 Vascular dementia, severe, with anxiety: Secondary | ICD-10-CM | POA: Diagnosis not present

## 2024-05-13 DIAGNOSIS — I10 Essential (primary) hypertension: Secondary | ICD-10-CM | POA: Diagnosis not present

## 2024-05-13 DIAGNOSIS — F411 Generalized anxiety disorder: Secondary | ICD-10-CM | POA: Diagnosis not present

## 2024-05-13 DIAGNOSIS — F331 Major depressive disorder, recurrent, moderate: Secondary | ICD-10-CM | POA: Diagnosis not present

## 2024-05-20 DIAGNOSIS — G309 Alzheimer's disease, unspecified: Secondary | ICD-10-CM | POA: Diagnosis not present

## 2024-05-20 DIAGNOSIS — F411 Generalized anxiety disorder: Secondary | ICD-10-CM | POA: Diagnosis not present

## 2024-05-20 DIAGNOSIS — F02C18 Dementia in other diseases classified elsewhere, severe, with other behavioral disturbance: Secondary | ICD-10-CM | POA: Diagnosis not present

## 2024-05-20 DIAGNOSIS — I69398 Other sequelae of cerebral infarction: Secondary | ICD-10-CM | POA: Diagnosis not present

## 2024-05-20 DIAGNOSIS — F331 Major depressive disorder, recurrent, moderate: Secondary | ICD-10-CM | POA: Diagnosis not present

## 2024-05-20 DIAGNOSIS — F01C4 Vascular dementia, severe, with anxiety: Secondary | ICD-10-CM | POA: Diagnosis not present

## 2024-06-17 DIAGNOSIS — G309 Alzheimer's disease, unspecified: Secondary | ICD-10-CM | POA: Diagnosis not present

## 2024-06-17 DIAGNOSIS — F02C18 Dementia in other diseases classified elsewhere, severe, with other behavioral disturbance: Secondary | ICD-10-CM | POA: Diagnosis not present

## 2024-06-17 DIAGNOSIS — F331 Major depressive disorder, recurrent, moderate: Secondary | ICD-10-CM | POA: Diagnosis not present

## 2024-06-17 DIAGNOSIS — F01C4 Vascular dementia, severe, with anxiety: Secondary | ICD-10-CM | POA: Diagnosis not present

## 2024-06-17 DIAGNOSIS — F411 Generalized anxiety disorder: Secondary | ICD-10-CM | POA: Diagnosis not present

## 2024-06-17 DIAGNOSIS — I69398 Other sequelae of cerebral infarction: Secondary | ICD-10-CM | POA: Diagnosis not present

## 2024-06-19 DIAGNOSIS — I10 Essential (primary) hypertension: Secondary | ICD-10-CM | POA: Diagnosis not present

## 2024-06-19 DIAGNOSIS — F419 Anxiety disorder, unspecified: Secondary | ICD-10-CM | POA: Diagnosis not present

## 2024-06-19 DIAGNOSIS — F339 Major depressive disorder, recurrent, unspecified: Secondary | ICD-10-CM | POA: Diagnosis not present
